# Patient Record
Sex: Male | Born: 1960 | Race: Black or African American | Hispanic: No | Marital: Married | Smoking: Never smoker
Health system: Southern US, Community
[De-identification: ages and names within clinical notes are randomized; demographics above are authoritative.]

## PROBLEM LIST (undated history)

## (undated) DIAGNOSIS — I639 Cerebral infarction, unspecified: Secondary | ICD-10-CM

## (undated) DIAGNOSIS — N19 Unspecified kidney failure: Secondary | ICD-10-CM

## (undated) DIAGNOSIS — N186 End stage renal disease: Secondary | ICD-10-CM

## (undated) DIAGNOSIS — I10 Essential (primary) hypertension: Secondary | ICD-10-CM

## (undated) DIAGNOSIS — D571 Sickle-cell disease without crisis: Secondary | ICD-10-CM

## (undated) DIAGNOSIS — Z992 Dependence on renal dialysis: Secondary | ICD-10-CM

## (undated) HISTORY — PX: KIDNEY STONE SURGERY: SHX686

---

## 1998-05-09 ENCOUNTER — Encounter: Payer: Self-pay | Admitting: Emergency Medicine

## 1998-05-09 ENCOUNTER — Emergency Department (HOSPITAL_COMMUNITY): Admission: EM | Admit: 1998-05-09 | Discharge: 1998-05-09 | Payer: Self-pay | Admitting: *Deleted

## 1999-05-23 ENCOUNTER — Emergency Department (HOSPITAL_COMMUNITY): Admission: EM | Admit: 1999-05-23 | Discharge: 1999-05-23 | Payer: Self-pay | Admitting: *Deleted

## 1999-11-12 ENCOUNTER — Encounter: Payer: Self-pay | Admitting: Emergency Medicine

## 1999-11-12 ENCOUNTER — Emergency Department (HOSPITAL_COMMUNITY): Admission: EM | Admit: 1999-11-12 | Discharge: 1999-11-12 | Payer: Self-pay | Admitting: Emergency Medicine

## 2000-07-22 ENCOUNTER — Inpatient Hospital Stay (HOSPITAL_COMMUNITY): Admission: EM | Admit: 2000-07-22 | Discharge: 2000-07-27 | Payer: Self-pay | Admitting: Emergency Medicine

## 2000-07-23 ENCOUNTER — Encounter: Payer: Self-pay | Admitting: Family Medicine

## 2000-07-24 ENCOUNTER — Encounter: Payer: Self-pay | Admitting: Nephrology

## 2000-07-29 ENCOUNTER — Encounter: Admission: RE | Admit: 2000-07-29 | Discharge: 2000-07-29 | Payer: Self-pay | Admitting: Sports Medicine

## 2000-09-23 ENCOUNTER — Encounter: Admission: RE | Admit: 2000-09-23 | Discharge: 2000-12-22 | Payer: Self-pay | Admitting: Family Medicine

## 2003-05-22 ENCOUNTER — Emergency Department (HOSPITAL_COMMUNITY): Admission: EM | Admit: 2003-05-22 | Discharge: 2003-05-23 | Payer: Self-pay | Admitting: Emergency Medicine

## 2003-06-03 ENCOUNTER — Emergency Department (HOSPITAL_COMMUNITY): Admission: EM | Admit: 2003-06-03 | Discharge: 2003-06-03 | Payer: Self-pay | Admitting: Emergency Medicine

## 2003-12-02 ENCOUNTER — Emergency Department (HOSPITAL_COMMUNITY): Admission: EM | Admit: 2003-12-02 | Discharge: 2003-12-02 | Payer: Self-pay | Admitting: Family Medicine

## 2004-06-25 ENCOUNTER — Encounter: Admission: RE | Admit: 2004-06-25 | Discharge: 2004-06-25 | Payer: Self-pay | Admitting: Occupational Medicine

## 2004-08-21 ENCOUNTER — Encounter: Admission: RE | Admit: 2004-08-21 | Discharge: 2004-11-19 | Payer: Self-pay | Admitting: Internal Medicine

## 2004-11-26 ENCOUNTER — Encounter: Admission: RE | Admit: 2004-11-26 | Discharge: 2004-11-26 | Payer: Self-pay | Admitting: Internal Medicine

## 2006-01-02 ENCOUNTER — Encounter: Payer: Self-pay | Admitting: Family Medicine

## 2006-01-03 ENCOUNTER — Inpatient Hospital Stay (HOSPITAL_COMMUNITY): Admission: EM | Admit: 2006-01-03 | Discharge: 2006-01-06 | Payer: Self-pay | Admitting: Internal Medicine

## 2006-01-05 ENCOUNTER — Encounter: Payer: Self-pay | Admitting: Vascular Surgery

## 2006-01-05 ENCOUNTER — Encounter (INDEPENDENT_AMBULATORY_CARE_PROVIDER_SITE_OTHER): Payer: Self-pay | Admitting: Interventional Cardiology

## 2006-08-28 ENCOUNTER — Encounter: Admission: RE | Admit: 2006-08-28 | Discharge: 2006-08-28 | Payer: Self-pay | Admitting: Internal Medicine

## 2007-01-19 ENCOUNTER — Encounter: Payer: Self-pay | Admitting: Emergency Medicine

## 2007-01-19 ENCOUNTER — Inpatient Hospital Stay (HOSPITAL_COMMUNITY): Admission: EM | Admit: 2007-01-19 | Discharge: 2007-01-21 | Payer: Self-pay

## 2007-01-21 ENCOUNTER — Encounter (INDEPENDENT_AMBULATORY_CARE_PROVIDER_SITE_OTHER): Payer: Self-pay | Admitting: Internal Medicine

## 2008-09-04 ENCOUNTER — Emergency Department (HOSPITAL_COMMUNITY): Admission: EM | Admit: 2008-09-04 | Discharge: 2008-09-04 | Payer: Self-pay | Admitting: Emergency Medicine

## 2008-10-13 ENCOUNTER — Emergency Department (HOSPITAL_COMMUNITY): Admission: EM | Admit: 2008-10-13 | Discharge: 2008-10-13 | Payer: Self-pay | Admitting: Emergency Medicine

## 2008-11-21 ENCOUNTER — Encounter (HOSPITAL_COMMUNITY): Admission: RE | Admit: 2008-11-21 | Discharge: 2009-02-19 | Payer: Self-pay | Admitting: Nephrology

## 2008-12-08 ENCOUNTER — Other Ambulatory Visit: Payer: Self-pay | Admitting: Nephrology

## 2008-12-19 ENCOUNTER — Ambulatory Visit: Payer: Self-pay | Admitting: Vascular Surgery

## 2008-12-20 ENCOUNTER — Encounter: Admission: RE | Admit: 2008-12-20 | Discharge: 2009-02-14 | Payer: Self-pay | Admitting: Nephrology

## 2009-01-25 ENCOUNTER — Emergency Department (HOSPITAL_COMMUNITY): Admission: EM | Admit: 2009-01-25 | Discharge: 2009-01-25 | Payer: Self-pay | Admitting: Emergency Medicine

## 2009-02-20 ENCOUNTER — Encounter: Admission: RE | Admit: 2009-02-20 | Discharge: 2009-02-20 | Payer: Self-pay | Admitting: Nephrology

## 2009-03-01 ENCOUNTER — Encounter (HOSPITAL_COMMUNITY): Admission: RE | Admit: 2009-03-01 | Discharge: 2009-05-28 | Payer: Self-pay | Admitting: Nephrology

## 2009-03-03 ENCOUNTER — Inpatient Hospital Stay (HOSPITAL_COMMUNITY): Admission: EM | Admit: 2009-03-03 | Discharge: 2009-03-03 | Payer: Self-pay | Admitting: Emergency Medicine

## 2009-03-13 ENCOUNTER — Ambulatory Visit: Payer: Self-pay | Admitting: Vascular Surgery

## 2009-03-23 ENCOUNTER — Ambulatory Visit: Payer: Self-pay | Admitting: Vascular Surgery

## 2009-03-23 ENCOUNTER — Ambulatory Visit (HOSPITAL_COMMUNITY): Admission: RE | Admit: 2009-03-23 | Discharge: 2009-03-23 | Payer: Self-pay | Admitting: Vascular Surgery

## 2009-05-08 ENCOUNTER — Ambulatory Visit: Payer: Self-pay | Admitting: Vascular Surgery

## 2009-05-29 ENCOUNTER — Emergency Department (HOSPITAL_COMMUNITY): Admission: EM | Admit: 2009-05-29 | Discharge: 2009-05-29 | Payer: Self-pay | Admitting: Emergency Medicine

## 2009-08-02 ENCOUNTER — Inpatient Hospital Stay (HOSPITAL_COMMUNITY): Admission: AD | Admit: 2009-08-02 | Discharge: 2009-08-11 | Payer: Self-pay | Admitting: Nephrology

## 2009-10-14 ENCOUNTER — Emergency Department (HOSPITAL_COMMUNITY): Admission: EM | Admit: 2009-10-14 | Discharge: 2009-10-14 | Payer: Self-pay | Admitting: Emergency Medicine

## 2010-03-10 ENCOUNTER — Encounter: Payer: Self-pay | Admitting: Nephrology

## 2010-04-21 ENCOUNTER — Emergency Department (HOSPITAL_COMMUNITY): Payer: 59

## 2010-04-21 ENCOUNTER — Emergency Department (HOSPITAL_COMMUNITY)
Admission: EM | Admit: 2010-04-21 | Discharge: 2010-04-21 | Disposition: A | Discharge status: Home / Self Care | Payer: 59 | Attending: Emergency Medicine | Admitting: Emergency Medicine

## 2010-04-21 DIAGNOSIS — I120 Hypertensive chronic kidney disease with stage 5 chronic kidney disease or end stage renal disease: Secondary | ICD-10-CM | POA: Insufficient documentation

## 2010-04-21 DIAGNOSIS — R0602 Shortness of breath: Secondary | ICD-10-CM | POA: Insufficient documentation

## 2010-04-21 DIAGNOSIS — N186 End stage renal disease: Secondary | ICD-10-CM | POA: Insufficient documentation

## 2010-04-21 DIAGNOSIS — Z8673 Personal history of transient ischemic attack (TIA), and cerebral infarction without residual deficits: Secondary | ICD-10-CM | POA: Insufficient documentation

## 2010-04-21 DIAGNOSIS — Z992 Dependence on renal dialysis: Secondary | ICD-10-CM | POA: Insufficient documentation

## 2010-04-21 DIAGNOSIS — R079 Chest pain, unspecified: Secondary | ICD-10-CM | POA: Insufficient documentation

## 2010-04-21 DIAGNOSIS — F411 Generalized anxiety disorder: Secondary | ICD-10-CM | POA: Insufficient documentation

## 2010-04-21 DIAGNOSIS — R51 Headache: Secondary | ICD-10-CM | POA: Insufficient documentation

## 2010-04-21 LAB — BASIC METABOLIC PANEL
CO2: 28 mEq/L (ref 19–32)
Calcium: 9.4 mg/dL (ref 8.4–10.5)
GFR calc Af Amer: 5 mL/min — ABNORMAL LOW (ref >60)
GFR calc non Af Amer: 4 mL/min — ABNORMAL LOW (ref >60)
Glucose, Bld: 113 mg/dL — ABNORMAL HIGH (ref 70–99)
Potassium: 4.6 mEq/L (ref 3.5–5.1)
Sodium: 135 mEq/L (ref 135–145)

## 2010-05-02 LAB — URINE MICROSCOPIC-ADD ON

## 2010-05-02 LAB — DIFFERENTIAL
Blasts: 0 %
Eosinophils Absolute: 2.1 10*3/uL — ABNORMAL HIGH (ref 0.0–0.7)
Eosinophils Relative: 14 % — ABNORMAL HIGH (ref 0–5)
Metamyelocytes Relative: 0 %
Myelocytes: 0 %
Promyelocytes Absolute: 0 %
nRBC: 0 /100 WBC

## 2010-05-02 LAB — BASIC METABOLIC PANEL
CO2: 26 mEq/L (ref 19–32)
Calcium: 8.8 mg/dL (ref 8.4–10.5)
Creatinine, Ser: 9.52 mg/dL — ABNORMAL HIGH (ref 0.4–1.5)
GFR calc non Af Amer: 6 mL/min — ABNORMAL LOW (ref >60)
Glucose, Bld: 171 mg/dL — ABNORMAL HIGH (ref 70–99)

## 2010-05-02 LAB — CBC
MCH: 31.3 pg (ref 26.0–34.0)
MCHC: 32.8 g/dL (ref 30.0–36.0)
Platelets: 397 10*3/uL (ref 150–400)
RDW: 17.9 % — ABNORMAL HIGH (ref 11.5–15.5)

## 2010-05-02 LAB — URINALYSIS, ROUTINE W REFLEX MICROSCOPIC
Nitrite: NEGATIVE
Specific Gravity, Urine: 1.014 (ref 1.005–1.030)
Urobilinogen, UA: 0.2 mg/dL (ref 0.0–1.0)
pH: 7.5 (ref 5.0–8.0)

## 2010-05-02 LAB — POCT CARDIAC MARKERS
Myoglobin, poc: 404 ng/mL (ref 12–200)
Troponin i, poc: <0.05 ng/mL (ref 0.00–0.09)
Troponin i, poc: <0.05 ng/mL (ref 0.00–0.09)

## 2010-05-04 LAB — COMPREHENSIVE METABOLIC PANEL
ALT: 29 U/L (ref 0–53)
AST: 25 U/L (ref 0–37)
CO2: 20 mEq/L (ref 19–32)
Calcium: 8 mg/dL — ABNORMAL LOW (ref 8.4–10.5)
GFR calc Af Amer: 6 mL/min — ABNORMAL LOW (ref >60)
Sodium: 129 mEq/L — ABNORMAL LOW (ref 135–145)
Total Protein: 6.5 g/dL (ref 6.0–8.3)

## 2010-05-04 LAB — URINALYSIS, ROUTINE W REFLEX MICROSCOPIC
Bilirubin Urine: NEGATIVE
Glucose, UA: 100 mg/dL — AB
Ketones, ur: NEGATIVE mg/dL
Leukocytes, UA: NEGATIVE
pH: 5.5 (ref 5.0–8.0)

## 2010-05-04 LAB — CBC
MCHC: 34.9 g/dL (ref 30.0–36.0)
RBC: 3.24 MIL/uL — ABNORMAL LOW (ref 4.22–5.81)

## 2010-05-04 LAB — URINE MICROSCOPIC-ADD ON

## 2010-05-04 LAB — FERRITIN: Ferritin: 700 ng/mL — ABNORMAL HIGH (ref 22–322)

## 2010-05-04 LAB — HEPATITIS B SURFACE ANTIGEN: Hepatitis B Surface Ag: NEGATIVE

## 2010-05-04 LAB — DIFFERENTIAL
Eosinophils Absolute: 0.8 10*3/uL — ABNORMAL HIGH (ref 0.0–0.7)
Eosinophils Relative: 5 % (ref 0–5)
Lymphs Abs: 1.8 10*3/uL (ref 0.7–4.0)
Monocytes Relative: 11 % (ref 3–12)

## 2010-05-04 LAB — PTH, INTACT AND CALCIUM: PTH: 486.8 pg/mL — ABNORMAL HIGH (ref 14.0–72.0)

## 2010-05-04 LAB — IRON AND TIBC: UIBC: <55 ug/dL

## 2010-05-04 LAB — PHOSPHORUS: Phosphorus: 6.4 mg/dL — ABNORMAL HIGH (ref 2.3–4.6)

## 2010-05-05 LAB — CBC
HCT: 20.1 % — ABNORMAL LOW (ref 39.0–52.0)
HCT: 22 % — ABNORMAL LOW (ref 39.0–52.0)
HCT: 23.5 % — ABNORMAL LOW (ref 39.0–52.0)
HCT: 25.8 % — ABNORMAL LOW (ref 39.0–52.0)
HCT: 29.1 % — ABNORMAL LOW (ref 39.0–52.0)
Hemoglobin: 6.8 g/dL — CL (ref 13.0–17.0)
Hemoglobin: 7.9 g/dL — ABNORMAL LOW (ref 13.0–17.0)
Hemoglobin: 8.9 g/dL — ABNORMAL LOW (ref 13.0–17.0)
MCH: 31.4 pg (ref 26.0–34.0)
MCHC: 33.4 g/dL (ref 30.0–36.0)
MCHC: 33.7 g/dL (ref 30.0–36.0)
MCHC: 33.7 g/dL (ref 30.0–36.0)
MCHC: 33.7 g/dL (ref 30.0–36.0)
MCHC: 33.7 g/dL (ref 30.0–36.0)
MCHC: 34.3 g/dL (ref 30.0–36.0)
MCHC: 34.6 g/dL (ref 30.0–36.0)
MCV: 91.1 fL (ref 78.0–100.0)
MCV: 91.9 fL (ref 78.0–100.0)
MCV: 92.1 fL (ref 78.0–100.0)
MCV: 92.1 fL (ref 78.0–100.0)
MCV: 92.7 fL (ref 78.0–100.0)
MCV: 92.8 fL (ref 78.0–100.0)
Platelets: 281 10*3/uL (ref 150–400)
Platelets: 288 K/uL (ref 150–400)
Platelets: 290 10*3/uL (ref 150–400)
Platelets: 310 10*3/uL (ref 150–400)
Platelets: 316 10*3/uL (ref 150–400)
Platelets: 346 10*3/uL (ref 150–400)
RBC: 2.17 MIL/uL — ABNORMAL LOW (ref 4.22–5.81)
RBC: 2.79 MIL/uL — ABNORMAL LOW (ref 4.22–5.81)
RBC: 3.06 MIL/uL — ABNORMAL LOW (ref 4.22–5.81)
RDW: 14.2 % (ref 11.5–15.5)
RDW: 14.2 % (ref 11.5–15.5)
RDW: 14.4 % (ref 11.5–15.5)
RDW: 14.5 % (ref 11.5–15.5)
RDW: 14.6 % (ref 11.5–15.5)
RDW: 14.7 % (ref 11.5–15.5)
RDW: 14.7 % (ref 11.5–15.5)
WBC: 12.2 10*3/uL — ABNORMAL HIGH (ref 4.0–10.5)
WBC: 12.4 K/uL — ABNORMAL HIGH (ref 4.0–10.5)
WBC: 16.2 10*3/uL — ABNORMAL HIGH (ref 4.0–10.5)

## 2010-05-05 LAB — URINALYSIS, ROUTINE W REFLEX MICROSCOPIC
Bilirubin Urine: NEGATIVE
Bilirubin Urine: NEGATIVE
Glucose, UA: NEGATIVE mg/dL
Ketones, ur: NEGATIVE mg/dL
Ketones, ur: NEGATIVE mg/dL
Leukocytes, UA: NEGATIVE
Nitrite: NEGATIVE
Protein, ur: 100 mg/dL — AB
Specific Gravity, Urine: 1.006 (ref 1.005–1.030)
Specific Gravity, Urine: 1.012 (ref 1.005–1.030)
Urobilinogen, UA: 0.2 mg/dL (ref 0.0–1.0)
pH: 5.5 (ref 5.0–8.0)
pH: 7.5 (ref 5.0–8.0)

## 2010-05-05 LAB — CULTURE, BLOOD (ROUTINE X 2)

## 2010-05-05 LAB — DIFFERENTIAL
Basophils Absolute: 0 10*3/uL (ref 0.0–0.1)
Basophils Absolute: 0 10*3/uL (ref 0.0–0.1)
Basophils Absolute: 0.1 10*3/uL (ref 0.0–0.1)
Basophils Absolute: 0.1 K/uL (ref 0.0–0.1)
Basophils Relative: 0 % (ref 0–1)
Basophils Relative: 0 % (ref 0–1)
Basophils Relative: 1 % (ref 0–1)
Basophils Relative: 1 % (ref 0–1)
Basophils Relative: 1 % (ref 0–1)
Eosinophils Absolute: 0.6 10*3/uL (ref 0.0–0.7)
Eosinophils Absolute: 0.6 10*3/uL (ref 0.0–0.7)
Eosinophils Absolute: 0.6 K/uL (ref 0.0–0.7)
Eosinophils Absolute: 0.7 10*3/uL (ref 0.0–0.7)
Eosinophils Absolute: 0.8 10*3/uL — ABNORMAL HIGH (ref 0.0–0.7)
Eosinophils Relative: 4 % (ref 0–5)
Eosinophils Relative: 5 % (ref 0–5)
Lymphocytes Relative: 11 % — ABNORMAL LOW (ref 12–46)
Lymphocytes Relative: 14 % (ref 12–46)
Lymphocytes Relative: 15 % (ref 12–46)
Lymphs Abs: 1.7 10*3/uL (ref 0.7–4.0)
Lymphs Abs: 1.7 K/uL (ref 0.7–4.0)
Lymphs Abs: 1.8 10*3/uL (ref 0.7–4.0)
Monocytes Absolute: 1.4 10*3/uL — ABNORMAL HIGH (ref 0.1–1.0)
Monocytes Absolute: 1.5 K/uL — ABNORMAL HIGH (ref 0.1–1.0)
Monocytes Absolute: 2 10*3/uL — ABNORMAL HIGH (ref 0.1–1.0)
Monocytes Relative: 10 % (ref 3–12)
Monocytes Relative: 12 % (ref 3–12)
Monocytes Relative: 9 % (ref 3–12)
Neutro Abs: 11.2 10*3/uL — ABNORMAL HIGH (ref 1.7–7.7)
Neutro Abs: 15 10*3/uL — ABNORMAL HIGH (ref 1.7–7.7)
Neutro Abs: 8.6 K/uL — ABNORMAL HIGH (ref 1.7–7.7)
Neutrophils Relative %: 66 % (ref 43–77)
Neutrophils Relative %: 69 % (ref 43–77)
Neutrophils Relative %: 73 % (ref 43–77)
Neutrophils Relative %: 73 % (ref 43–77)
Neutrophils Relative %: 75 % (ref 43–77)
Neutrophils Relative %: 75 % (ref 43–77)

## 2010-05-05 LAB — URINE MICROSCOPIC-ADD ON

## 2010-05-05 LAB — RENAL FUNCTION PANEL
Albumin: 3.2 g/dL — ABNORMAL LOW (ref 3.5–5.2)
Albumin: 3.4 g/dL — ABNORMAL LOW (ref 3.5–5.2)
Albumin: 3.4 g/dL — ABNORMAL LOW (ref 3.5–5.2)
Albumin: 3.5 g/dL (ref 3.5–5.2)
Albumin: 3.5 g/dL (ref 3.5–5.2)
Albumin: 3.6 g/dL (ref 3.5–5.2)
Albumin: 3.6 g/dL (ref 3.5–5.2)
BUN: 41 mg/dL — ABNORMAL HIGH (ref 6–23)
BUN: 52 mg/dL — ABNORMAL HIGH (ref 6–23)
BUN: 69 mg/dL — ABNORMAL HIGH (ref 6–23)
BUN: 78 mg/dL — ABNORMAL HIGH (ref 6–23)
CO2: 19 mEq/L (ref 19–32)
Calcium: 8.4 mg/dL (ref 8.4–10.5)
Calcium: 8.5 mg/dL (ref 8.4–10.5)
Calcium: 8.6 mg/dL (ref 8.4–10.5)
Calcium: 8.6 mg/dL (ref 8.4–10.5)
Calcium: 9 mg/dL (ref 8.4–10.5)
Chloride: 100 mEq/L (ref 96–112)
Chloride: 101 mEq/L (ref 96–112)
Chloride: 104 mEq/L (ref 96–112)
Chloride: 104 mEq/L (ref 96–112)
Chloride: 106 mEq/L (ref 96–112)
Creatinine, Ser: 11.25 mg/dL — ABNORMAL HIGH (ref 0.4–1.5)
Creatinine, Ser: 12.16 mg/dL — ABNORMAL HIGH (ref 0.4–1.5)
Creatinine, Ser: 9.24 mg/dL — ABNORMAL HIGH (ref 0.4–1.5)
Creatinine, Ser: 9.86 mg/dL — ABNORMAL HIGH (ref 0.4–1.5)
GFR calc Af Amer: 5 mL/min — ABNORMAL LOW (ref >60)
GFR calc Af Amer: 7 mL/min — ABNORMAL LOW (ref >60)
GFR calc Af Amer: 7 mL/min — ABNORMAL LOW (ref >60)
GFR calc Af Amer: 7 mL/min — ABNORMAL LOW (ref >60)
GFR calc Af Amer: 8 mL/min — ABNORMAL LOW (ref >60)
GFR calc Af Amer: 8 mL/min — ABNORMAL LOW (ref >60)
GFR calc non Af Amer: 4 mL/min — ABNORMAL LOW (ref >60)
GFR calc non Af Amer: 5 mL/min — ABNORMAL LOW (ref >60)
GFR calc non Af Amer: 6 mL/min — ABNORMAL LOW (ref >60)
GFR calc non Af Amer: 6 mL/min — ABNORMAL LOW (ref >60)
GFR calc non Af Amer: 6 mL/min — ABNORMAL LOW (ref >60)
Glucose, Bld: 110 mg/dL — ABNORMAL HIGH (ref 70–99)
Glucose, Bld: 134 mg/dL — ABNORMAL HIGH (ref 70–99)
Glucose, Bld: 93 mg/dL (ref 70–99)
Phosphorus: 4.2 mg/dL (ref 2.3–4.6)
Phosphorus: 4.6 mg/dL (ref 2.3–4.6)
Phosphorus: 5 mg/dL — ABNORMAL HIGH (ref 2.3–4.6)
Phosphorus: 5 mg/dL — ABNORMAL HIGH (ref 2.3–4.6)
Phosphorus: 5.1 mg/dL — ABNORMAL HIGH (ref 2.3–4.6)
Phosphorus: 5.4 mg/dL — ABNORMAL HIGH (ref 2.3–4.6)
Potassium: 4.1 mEq/L (ref 3.5–5.1)
Potassium: 4.3 mEq/L (ref 3.5–5.1)
Potassium: 4.4 mEq/L (ref 3.5–5.1)
Potassium: 4.5 mEq/L (ref 3.5–5.1)
Potassium: 5.1 mEq/L (ref 3.5–5.1)
Sodium: 131 mEq/L — ABNORMAL LOW (ref 135–145)
Sodium: 133 mEq/L — ABNORMAL LOW (ref 135–145)
Sodium: 133 mEq/L — ABNORMAL LOW (ref 135–145)
Sodium: 133 mEq/L — ABNORMAL LOW (ref 135–145)
Sodium: 133 mEq/L — ABNORMAL LOW (ref 135–145)
Sodium: 135 mEq/L (ref 135–145)

## 2010-05-05 LAB — GLUCOSE, CAPILLARY
Glucose-Capillary: 111 mg/dL — ABNORMAL HIGH (ref 70–99)
Glucose-Capillary: 113 mg/dL — ABNORMAL HIGH (ref 70–99)
Glucose-Capillary: 113 mg/dL — ABNORMAL HIGH (ref 70–99)
Glucose-Capillary: 114 mg/dL — ABNORMAL HIGH (ref 70–99)
Glucose-Capillary: 122 mg/dL — ABNORMAL HIGH (ref 70–99)
Glucose-Capillary: 122 mg/dL — ABNORMAL HIGH (ref 70–99)

## 2010-05-05 LAB — POCT HEMOGLOBIN-HEMACUE
Hemoglobin: 11 g/dL — ABNORMAL LOW (ref 13.0–17.0)
Hemoglobin: 11.1 g/dL — ABNORMAL LOW (ref 13.0–17.0)

## 2010-05-05 LAB — IRON AND TIBC
Saturation Ratios: 25 % (ref 20–55)
UIBC: 159 ug/dL

## 2010-05-05 LAB — CROSSMATCH: Antibody Screen: NEGATIVE

## 2010-05-05 LAB — URINE CULTURE

## 2010-05-05 LAB — ABO/RH: ABO/RH(D): A POS

## 2010-05-05 LAB — FERRITIN: Ferritin: 804 ng/mL — ABNORMAL HIGH (ref 22–322)

## 2010-05-05 LAB — HEPATITIS B SURFACE ANTIBODY,QUALITATIVE: Hep B S Ab: NEGATIVE

## 2010-05-08 LAB — POCT HEMOGLOBIN-HEMACUE: Hemoglobin: 11.6 g/dL — ABNORMAL LOW (ref 13.0–17.0)

## 2010-05-21 LAB — POCT HEMOGLOBIN-HEMACUE: Hemoglobin: 11 g/dL — ABNORMAL LOW (ref 13.0–17.0)

## 2010-05-21 LAB — IRON AND TIBC
Iron: 35 ug/dL — ABNORMAL LOW (ref 42–135)
Saturation Ratios: 13 % — ABNORMAL LOW (ref 20–55)
UIBC: 228 ug/dL

## 2010-05-22 LAB — IRON AND TIBC
Saturation Ratios: 13 % — ABNORMAL LOW (ref 20–55)
TIBC: 230 ug/dL (ref 215–435)

## 2010-05-22 LAB — FERRITIN: Ferritin: 130 ng/mL (ref 22–322)

## 2010-05-22 LAB — POCT HEMOGLOBIN-HEMACUE
Hemoglobin: 10 g/dL — ABNORMAL LOW (ref 13.0–17.0)
Hemoglobin: 10.1 g/dL — ABNORMAL LOW (ref 13.0–17.0)

## 2010-05-23 LAB — POCT HEMOGLOBIN-HEMACUE
Hemoglobin: 8.7 g/dL — ABNORMAL LOW (ref 13.0–17.0)
Hemoglobin: 8.9 g/dL — ABNORMAL LOW (ref 13.0–17.0)

## 2010-07-02 NOTE — Assessment & Plan Note (Signed)
OFFICE VISIT   MOISE, FRIDAY  DOB:  01-11-1961                                       05/08/2009  ZOXWR#:[id number]   The patient is a patient with end-stage renal disease and had creation  of left radial artery to cephalic vein AV fistula or Cimino shunt on  February 4.  He is not yet on hemodialysis.  He has had no pain or  numbness in the left hand.   On exam the incision has healed nicely and he has a pulse and palpable  thrill from the fistula from the wrist up to the antecubital area.  It  appears that this will be adequate fistula for hemodialysis.  He was  encouraged to continue to exercise the left arm and will return to see  [place] on a p.r.n. basis.     Quita Skye Hart Rochester, M.D.  Electronically Signed   JDL/MEDQ  D:  05/08/2009  T:  05/09/2009  Job:  [id number]

## 2010-07-02 NOTE — H&P (Signed)
NAME:  Gabriel Bell, Gabriel Bell NO.:  [id number]   MEDICAL RECORD NO.:  [id number]          PATIENT TYPE:  EMS   LOCATION:  ED                           FACILITY[facility]  [facility]   PHYSICIAN:  Thomasenia Bottoms, MDDATE OF BIRTH:  25-Aug-1960   DATE OF ADMISSION:  01/19/2007  DATE OF DISCHARGE:                              HISTORY & PHYSICAL   CHIEF COMPLAINT:  Chest pain.   HISTORY OF PRESENTING ILLNESS:  Gabriel Bell is a 50 year old gentleman  with a history of poorly-controlled hypertension who reports to the  hospital today with a 4-day history of mid sternal chest pain.  He says  for the last several days the pain would shoot out through his spine,  felt like something was stabbing him in the back.  He denies any  diaphoresis or nausea.  The back pain has resolved.  He says it is  totally gone today, but the chest pain has continued.  The patient has  been taking Motrin for the pain, and the Motrin does help for a couple  hours, but as soon as it wears off, the pain is back.  The patient has  been told before that he should not be taking Motrin, so he really got  worried and was not sure what else to do.  He has had some shortness of  breath with this chest discomfort as well, but no nausea, no heartburn  or acid reflux symptoms, no diaphoresis or cough.  He has not had  discomfort like this before.   PAST MEDICAL HISTORY:  His past medical history is significant for  hypertension, which has been difficult to control in the past.  He was  admitted in 2007 with significant hypertension and actually had a  negative catecholamine workup for pheochromocytoma.  He says he has a  history of a CVA in 2007 and dyslipidemia, as well.  He has obesity.  He  has been told that he has borderline diabetes and is controlling that  with diet, no medication.   MEDICATIONS:  On arrival include:  1. Benicar 40 mg p.o. daily.  2. Coreg CR 40 mg p.o. daily.  3. Clonidine 0.1 mg twice a day.  4. He is on an aspirin daily and Vytorin 10/40 one daily.   SOCIAL HISTORY:  He does not smoke cigarettes, drink alcohol use any  illicit drugs.   FAMILY HISTORY:  He is unaware of any coronary artery disease.  He does  not know much of his family history, says his grandfather which was  actually murdered at age 49.   REVIEW OF SYSTEMS:  CONSTITUTIONAL:  He says his appetite is poor but  that he has been gaining weight.  HEENT:  He did not have a headache  before being in the emergency department, says he may have some double  vision.  No difficulty swallowing.  No ringing in his ears.  CARDIOVASCULAR:  The chest pains as mentioned above.  No significant  lower extremity edema.  RESPIRATORY:  Shortness of breath since he has  had the chest pain, and he does describe  some chronic dyspnea on  exertion.  No orthopnea.  He sleeps in a chair, because it hurts his  side when he lay flat on the bed.  He denies any hemoptysis.  GI:  He  denies any blood in his stools.  He does have some constipation trouble.  He also reports some early satiety symptoms, but no heartburn or reflux  symptoms.  GU:  He denies hematuria.  PSYCHIATRIC:  He says he has a lot  of stress and does have difficulty sleeping.  MUSCULOSKELETAL:  He has a  various aches and pains, including this right side pain.  NEUROLOGIC:  No history of seizures.  From his previous stroke, he does have some  left-sided weakness and tenderness in the left lower leg.  All other  systems reviewed are negative.   On arrival in the emergency department, his blood pressure was 148/132,  pulse 60, respiratory rate 20, O2 sats 95% on room air.   PHYSICAL EXAMINATION:  GENERAL:  On physical examination, he is an obese  gentleman in no acute distress.  HEENT:  Exam is normocephalic, atraumatic.  Sclerae nonicteric.  Oral  mucosa moist.  NECK:  Supple.  No lymphadenopathy, no thyromegaly, no jugular venous  distention while sitting up at 90  degrees.  CARDIAC:  Exam is regular rate and rhythm, but his heart sounds are  somewhat distant.  LUNGS:  Clear to auscultation bilaterally with no wheezes, rhonchi or  rales.  ABDOMEN:  His abdomen is obese.  He has some mild tenderness over the  right rib cage and some in the right upper quadrant area.  No rebound or  guarding.  He does have bowel sounds.  No masses are appreciated.  EXTREMITIES:  His extremities reveal no evidence of clubbing, cyanosis  or edema.  He has faintly palpable DP pulses.  NEUROLOGICALLY:  The  patient is alert and oriented x3.  He has no slurred speech.  He has 5/5  strength in each of his extremities.  His sensory exam is grossly  intact.  MUSCULOSKELETAL:  Examination reveals no evidence of effusion of his  joints.  SKIN:  His skin is intact with no open lesions or rashes.   DATA:  The patient's sodium is 140, potassium 4.4, chloride 107, bicarb  20, glucose 108, BUN 21, creatinine 2.39.  His fibrin split products are  slightly high at 0.9.  His EKG reveals normal sinus rhythm with a rate  of 64.  He has some evidence of left axis deviation and some T-wave  flattening but no ST-segment elevations or depressions.   ASSESSMENT AND PLAN:  1. Atypical chest pain in a 50 year old with significant hypertension,      hyperlipidemia and obesity.  The plan is to admit him to the      hospital.  We will rule him out for MI.  Because of his severe high      blood pressure and the fact the pain was radiating into his back,      the patient will have an MRI to rule out dissection.  A CT angio is      contraindicated because of his renal function.  2. Malignant hypertension.  Initially, the patient's blood pressure      was 248/132.  In the emergency department, he received clonidine,      IV labetalol and sublingual nitroglycerin, and his last two      systolic blood pressures were 161 and 170, so his blood pressures  are improving.  I will add Norvasc to the  patient's blood pressure      regimen, as he is slightly bradycardic, and the Norvasc may be more      effective.  Would also like to increase his clonidine to t.i.d.      From his report, his blood pressure is poorly controlled even as an      outpatient on a good day.  3. Significant renal insufficiency with a creatinine of 2.39.  The      patient had known renal insufficiency, and his creatinine was 1.9.      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  Would consider holding      his Benicar at this juncture.  The patient of note, also did have      an echocardiogram in 2007 which revealed an EF of 50% with moderate      hypokinesis of the basal mid-posterior lateral wall, and he also      had increased LV thickness and left ventricle dilatation.      Thomasenia Bottoms, MD  Electronically Signed     CVC/MEDQ  D:  01/19/2007  T:  01/20/2007  Job:  [id number]   cc:   Olene Craven, M.D.  Fax: [phone]

## 2010-07-02 NOTE — Assessment & Plan Note (Signed)
OFFICE VISIT   Gabriel Bell, Gabriel Bell  DOB:  14-Jun-1960                                       03/13/2009  ZOXWR#:[id number]   The patient is a patient referred by Dr. Allena Katz with end-stage renal  disease who I evaluated in November of 2010 for vascular access.  He did  not show up for his surgical procedure which was scheduled for November  17.  He now is re-referrred for vascular access consideration.  He is  right-handed.   PAST MEDICAL HISTORY:  1. Hypertension.  2. Hyperlipidemia.  3. End-stage renal disease.  4. Impaired glucose tolerance.  5. Possible bipolar disorder.  6. Morbid obesity.  7. He denies any history of chest pain, dyspnea on exertion, PND or      orthopnea at the present time.   PHYSICAL EXAMINATION:  Vital signs:  Blood pressure is 199/122, heart  rate 67, respirations 14, temperature 97.8.  General:  He is alert,  oriented and in no apparent distress.  HEENT:  Exam unremarkable.  Neck:  Carotid pulses 3+ with no bruits.  Left upper extremity reveals a 3+  brachial and radial pulse.  He does have morbid obesity.   Vein mapping from previous evaluation was reviewed and it does appear  that he is borderline with possibly adequate vein in the left forearm  for fistula creation.  Upper arm is also borderline.   Creation of left forearm AV fistula versus upper arm AV fistula on  Friday, February 4.  Hopefully this will provide a satisfactory access  for this nice man.     Quita Skye Hart [place], M.D.  Electronically Signed   JDL/MEDQ  D:  03/13/2009  T:  03/14/2009  Job:  [id number]

## 2010-07-02 NOTE — Consult Note (Signed)
NEW PATIENT CONSULTATION   Gabriel Bell, Gabriel Bell  DOB:  18-Jul-1960                                       12/19/2008  ZOXWR#:[id number]   Mr. Taite is a 50 year old patient with end-stage renal disease referred  by Dr. Allena Katz for vascular access.  He is right-handed.  He has never had  vascular access nor hemodialysis in the past.  His creatinine has risen  from 3.3 last December to 7.9 this month and has compliance problems.  He will probably need vascular access.  He will need hemodialysis in the  near future.  Chronic medical problems are stable and include:  1. Hypertension.  2. Hyperlipidemia.  3. End-stage renal disease.  4. Impaired glucose tolerance.  5. Possible bipolar disorder.  6. Morbid obesity.   ALLERGIES:  None known.   SOCIAL HISTORY:  He is married and lives at home with his supportive  wife.  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  Denies any tobacco or alcohol use.   FAMILY HISTORY:  He has an extensive history of family members with  hypertension and diabetes.  He does not use tobacco or alcohol.  He is  disabled and has 3 children.   REVIEW OF SYSTEMS:  He does have occasional chest discomfort and dyspnea  on exertion, occasional wheezing, urinary frequency.  He has had  arthritis, joint pain, depression, and nosebleeds.  All other systems  negative.   ALLERGIES:  None known.   PHYSICAL EXAM:  Blood pressure 153/82, heart rate 64, respirations 16.  He is, in general, a morbidly obese male patient who is in no apparent  distress,  alert and oriented x3.  Neck is supple.  3+ carotid pulses  palpable.  No bruits are audible.  Chest:  Clear to auscultation.  Abdomen is obese.  He has 3+ femoral pulses bilaterally.  He does have  palpable brachial and radial pulses 3+ bilaterally.  Cephalic veins were  examined and are marginal in size.  I ordered vein mapping and personal  reviewed and interpreted this.  It appears that the cephalic vein in the  left arm is  slightly better than the right, particularly in the upper  arm.   We will attempt to create a fistula in this gentleman but it may well be  unsuccessful as I discussed with the family because of vein size.  I  have scheduled him for Tuesday, November 16th, at his request for  attempted left forearm fistula with possible left upper arm fistula by  Dr. Darrick Penna.  They understand that this may well be best for dialysis  purposes.   Quita Skye Hart [place], M.D.  Electronically Signed   JDL/MEDQ  D:  12/19/2008  T:  12/20/2008  Job:  [id number]   cc:   Zetta Bills, MD

## 2010-07-02 NOTE — Procedures (Signed)
CEPHALIC VEIN MAPPING   INDICATION:  Evaluate for AV fistula.   HISTORY:  Renal failure.   EXAM:   The right cephalic vein is compressible.   Diameter measurements range from 0.13 to 0.43 cm.   The left cephalic vein is compressible.   Diameter measurements range from 0.20 to 0.37 cm.   See attached worksheet for all measurements.   IMPRESSION:  1. Patent bilateral cephalic veins which are of acceptable diameter      for use as a dialysis access site.  2. The right cephalic is not well visualized above the antecubital      fossa level.   ___________________________________________  Quita Skye. Hart [place], M.D.   CB/MEDQ  D:  12/19/2008  T:  12/19/2008  Job:  [id number]

## 2010-07-02 NOTE — Discharge Summary (Signed)
NAMEKENYAN, KARNES NO.:  [id number]   MEDICAL RECORD NO.:  [id number]          PATIENT TYPE:  INP   LOCATION:  3733                         FACILITY:  [facility]   PHYSICIAN:  Ladell Pier, M.D.   DATE OF BIRTH:  1960-03-26   DATE OF ADMISSION:  01/19/2007  DATE OF DISCHARGE:  01/21/2007                               DISCHARGE SUMMARY   DISCHARGE DIAGNOSES:  1. Chest pain negative cardiac enzymes.  The patient to follow up for      results of 2-D echo with primary care doctor, 2-D echo results      pending on discharge  2. Hypertensive urgency.  3. Hypertension.  4. Obesity.  5. History of cerebrovascular accident in 2007.  6. Dyslipidemia.  7. Question of borderline diabetes.  8. Chronic renal insufficiency.   DISCHARGE MEDICATIONS:  1. Aspirin 81 mg daily.  2. Norvasc 10 mg daily.  3. Clonidine 0.2 mg t.i.d.  4. Lasix 20 mg b.i.d.  5. Hydralazine 10 mg t.i.d.  6. Benicar 40 mg daily.  7. Coreg 40 mg daily.  8. Vytorin nightly.   FOLLOW UP APPOINTMENT:  The patient to follow up with primary care  doctor, Dr. Barbee Shropshire, in one week.   PROCEDURES:  None.   CONSULTANT:  None.   HISTORY OF PRESENT ILLNESS:  The patient is a 50 year old African  American male with poorly controlled hypertension who reported the  hospital with four days of midsternal chest pain.  He stated it lasted  for several days.  Pain felt like it was shooting through his spine,  felt like something was stabbing him in his back.  He had known  diaphoresis.  The patient came in.  His pain was totally gone when he  came in.  He was taking Motrin for the pain.  He was told before that he  should not take Motrin.  He had some shortness of breath but no nausea.   Past medical history, family history, social history, medications,  allergies, and review of systems per admission H&P.   PHYSICAL EXAMINATION ON DISCHARGE:  VITAL SIGNS:  Temperature 97.5,  pulse 66, respirations 20, blood  pressure 146/81, pulse ox 99% on 2  liters nasal cannula.  HEENT:  Normocephalic, atraumatic.  Pupils reactive to light.  Throat  without erythema.  CARDIOVASCULAR:  Regular rate and rhythm, 2/6 systolic murmur.  LUNGS:  Clear bilaterally.  ABDOMEN:  Positive bowel sounds.  EXTREMITIES:  Without edema.   HOSPITAL COURSE:  PROBLEM #1 -  HYPERTENSIVE URGENCY:  The patient was  admitted to the hospital, placed on medications for blood pressure.  At  one time, his blood pressure was severely elevated and he was going to  be transferred to the step-down unit for drip, but blood pressure  improved and he was not transferred to the unit.  Then blood pressure  had gone up to 210/110.  With adjustment of his p.o. medications, on the  day of discharge his blood pressure 146/81.  He was scheduled to have  MRI done to rule out aortic dissection with his blood pressure  being  very elevated and the chest pain, but he was unable to do the MRI  because he could not sit in the machine.  He could not get a CT  angiogram of the chest because of his kidney function and he was also  too large for that machine.  He had a 2-D echo done today and the  results still pending.  Will follow up on that and also get patient's  primary care physician to follow up on the results of the 2-D echo.   PROBLEM #2 -  HYPERTENSION:  Blood pressure much better prior to  discharge.  The patient wants to go home today.  He will follow up with  his primary care physician.   PROBLEM #3 -  CHRONIC RENAL INSUFFICIENCY:  The patient has a history of  chronic renal insufficiency, baseline creatinine 1.9.  On discharge his  creatinine was to 2.02.   PROBLEM #4 -  QUESTION OF BORDERLINE DIABETES:  The patient stated that  he has borderline diabetes.  His hemoglobin A1c is pending on discharge.   PROBLEM #5 -  HYPERLIPIDEMIA:  He will continue the Vytorin on  discharge.   PROBLEM #6 -  OBESITY:  Discussed with him diet and  exercise.  He will  follow up in the outpatient clinic for further management of his  obesity.   PROBLEM #7 -  MILD ANEMIA:  He has mild anemia probably secondary to his  kidney functions:  He will follow up as an outpatient with his primary  care physician for further  workup of his mild decrease in hemoglobin.   DISCHARGE LABORATORY DATA:  Sodium 133, potassium 4.3, chloride 101, CO2  25, glucose 118, BUN 20, creatinine 2.02.  WBC 11.7, hemoglobin 11.4,  platelets 338.  TSH 2.639.  Lipid profile with total cholesterol 173,  triglycerides 134, HDL 46, LDL 100.      Ladell Pier, M.D.  Electronically Signed     NJ/MEDQ  D:  01/21/2007  T:  01/21/2007  Job:  [id number]   cc:   Olene Craven, M.D.

## 2010-07-05 NOTE — Discharge Summary (Signed)
Gabriel Bell, GRATTAN NO.:  [id number]   MEDICAL RECORD NO.:  [id number]          PATIENT TYPE:  INP   LOCATION:  1415                         FACILITY[facility]   PHYSICIAN:  Marcellus Scott, MD     DATE OF BIRTH:  03-03-1960   DATE OF ADMISSION:  01/03/2006  DATE OF DISCHARGE:  01/06/2006                               DISCHARGE SUMMARY   PRIMARY CARE PHYSICIAN:  Olene Craven, M.D.   ADDENDUM:  This is an addendum summary which will only outline the  events in this patient's inpatient care since yesterday, January 05, 2006.  Please refer to my interim discharge summary done on January 05, 2006, for discharge diagnoses, procedures done, pertinent lab data,  consultations obtained, as well as hospital course.   DISCHARGE MEDICATIONS:  1. Aspirin 325 mg p.o. daily.  2. Coreg 12.5 mg p.o. twice daily.  3. Lisinopril 10 mg p.o. daily.  4. Vytorin 10/40 one tablet p.o. daily.  5. Cardizem CD 360 mg p.o. daily.  6. Foltx one p.o. daily.  7. Norvasc 10 mg p.o. daily.   The patient has basically been asymptomatic since yesterday, however,  very anxious and agitated that he is still in the hospital and has been  repeatedly indicating to the nursing staff as well as myself that he  wants to be discharged.  At times, he has pulled out his monitor wires  and ready to leave.  Today, there have been fluctuations in his blood  pressure and even yesterday. This morning at 6 a.m. the blood pressure  was 215/112 and most recent one was 154/94.  His blood pressures also  seem to be elevated when he is anxious and agitated.  The patient has  been advised to stay so that we can further monitor and aim to control  his blood pressure better.  However, the patient indicates that he will  not stay any further.  He would like to go home and continue to take his  p.o. medications.   The patient's diagnoses, prognosis, current management, treatment  options, as well as  detrimental effects of noncompliance with  medications have been clearly explained to him as well as his spouse at  the bedside.  The patient and his spouse verbalize understanding and  claim that they will be compliant with medications and will be compliant  with follow-up with the physicians indicated.  The patient is to be  discharged on the antihypertensive medications above.  Norvasc is being  added today after discussion with cardiology who are also okay for him  to be discharged and follow up at their office next week.  An  echocardiogram has been done which basically  indicates left ventricle  to be dilated.  Overall left ventricular systolic function at lower  limits of normal.  LV ejection fraction at 50%.  Moderate hypokinesis of  the basal mid posterior lateral wall.  Left ventricular wall thickness  mildly increased.  Trivial aortic valve regurgitation.  Left atrial size  is upper limits of normal.  The workup for secondary causes of  hypertension including his 24-hour urine, catecholamines, and serum  renins are still pending.  This has to be followed up as an outpatient.  Also the patient has proteinuria, history of positive ANA, high blood  pressure, and chronic kidney disease as indicated in the interim  discharge summary.  Further workup as an outpatient.  The patient  and spouse have also been advised to return to the emergency room if  there is any recurrence or worsening of symptoms at any point.  An  appointment has been made for him to see the physicians indicated.   Consultations were as per the interim discharge summary.      Marcellus Scott, MD  Electronically Signed     AH/MEDQ  D:  01/06/2006  T:  01/06/2006  Job:  [id number]   cc:   Olene Craven, M.D.  Fax: [phone]   Cristy Hilts. Jacinto Halim, MD  Fax: [phone]   Pramod P. Pearlean Brownie, MD  Fax: [phone]

## 2010-07-05 NOTE — Consult Note (Signed)
NAME:  Gabriel Bell, Gabriel Bell NO.:  [id number]   MEDICAL RECORD NO.:  [id number]          PATIENT TYPE:  EMS   LOCATION:  MAJO                         FACILITY:  [facility]   PHYSICIAN:  Pramod P. Pearlean Brownie, MD    DATE OF BIRTH:  1960-08-26   DATE OF CONSULTATION:  DATE OF DISCHARGE:                                   CONSULTATION   REFERRING PHYSICIAN:  Nelva Nay, M.D.   REASON FOR REFERRAL:  Code Stroke.   HISTORY OF PRESENT ILLNESS:  Gabriel Bell is a 50 year old African American  gentleman who developed sudden onset of dizziness, blurred vision, gait  instability at about midnight earlier today.  He states his symptoms have  persisted.  At times he has had some slurred speech as well as some blurred  and double vision.  He has been leaning to the left while walking.  He went  to Urgent Care today where his blood pressure was found to be significantly  elevated and hence he was referred to [facility] for urgent CT scan and  stroke evaluation.  The patient denies any focal extremity weakness or  numbness.  There is no prior known history of stroke, TIA, seizures or  significant neurological problems.   PAST MEDICAL HISTORY:  1. Significant for hypertension.  2. Hyperlipidemia.  3. Obesity.  4. Diabetes.   FAMILY HISTORY:  Not significant for neurological problems of stroke.   SURGICAL HISTORY:  None.   SOCIAL HISTORY:  The patient lives at home with his wife.  He does not smoke  or abuse drugs.  He drinks alcohol only occasionally.   MEDICATIONS:  List not available at present.   ALLERGIES TO MEDICATIONS:  None.   REVIEW OF SYSTEMS:  Positive for headache, blurred vision and dizziness,  speech difficulties.  No chest pain, shortness of breath, cough or diarrhea.   PHYSICAL EXAM:  Reveals obese young [place] American gentleman who is not in  distress.  He is afebrile.  Pulse rate is 70 per minute regular, respiratory  16 per minute, blood pressure is 240/134.   Distal pulses are well felt.  Head is nontraumatic.  Neck is supple without bruit.  ENT exam unremarkable.  CARDIAC:  Regular heart sounds.  LUNGS:  Clear to auscultation.  ABDOMEN:  Soft, nontender.  NEUROLOGICAL EXAM:  The patient is awake, alert, oriented to time, place and  person.  There is no aphasia, apraxia or dysarthria.  Pupils are equal,  reactive.  Eye movements are full range without nystagmus.  Face is  symmetric.  Palatal movements are normal.  Tongue is midline.  Motor system  exam:  No upper extremity drift, symmetric strength, tone and reflexes.  Finger-to-nose coordination is slightly impaired on the right.  There is no  coordination, gait was not tested.   DATA REVIEWED:  CT scan of the head noncontrast study done today reveals no  acute abnormality nor hemorrhage or infarct are seen.  Admission labs are  pending at this time.   IMPRESSION:  A 50 year old gentleman with sudden onset of gait ataxia,  dizziness and blurred  vision in the setting of significantly elevated  hypertension.  This may represent hypertensive crisis though a small  posterior circulation brain stem infarct cannot be reliably ruled out.   PLAN:  I would recommend admission to the medical service.  Strict control  of hypertension.  Use IV labetalol p.r.n. and start Cardene drip to bring  blood pressure down to 180 systolic.  Check MRI scan of the brain, 2-D echo,  carotid ultrasound and fasting lipid profile, hemoglobin A1c, homocystine.  Physical, occupational therapy consults.  I would be happy to follow the  patient in consult and call for questions.           ______________________________  Sunny Schlein. Pearlean Brownie, MD     PPS/MEDQ  D:  01/02/2006  T:  01/03/2006  Job:  [id number]   cc:   Olene Craven, M.D.

## 2010-07-05 NOTE — Consult Note (Signed)
NAMESION, REINDERS NO.:  [id number]   MEDICAL RECORD NO.:  [id number]          PATIENT TYPE:  INP   LOCATION:  0154                         FACILITY:  [facility]   PHYSICIAN:  Gabriel Hilts. Jacinto Halim, MD       DATE OF BIRTH:  Oct 25, 1960   DATE OF CONSULTATION:  01/03/2006  DATE OF DISCHARGE:                                 CONSULTATION   REFERRING PHYSICIAN:  Marcellus Bell, M.D.   REASON FOR CONSULTATION:  Hypertensive emergency, abnormal cardiac  enzymes.   HISTORY OF PRESENT ILLNESS:  [name] is a 50 year old  gentleman with a history of known hypertension, obesity and  hyperlipidemia who was admitted to [facility] complaining of  blurred vision last evening and dizziness.  In the emergency room, he  was found to have blood pressure of 240/134 mmHg.  During this, he was  admitted as hypertensive emergency.   There is a suspicion for a new onset of posterior cerebral circulation  stroke secondary to hypertensive emergency.  He was initially admitted  to intensive care unit with IV Cardene and his blood pressure is now  much better controlled.  As a part of workup, cardiac enzymes were  performed and the troponins positive.  Hence, he was referred for  further cardiovascular risk stratification.   The patient denies any chest pain, denies shortness of breath, denies  any paroxysm nocturnal dyspnea or orthopnea.   REVIEW OF SYSTEMS:  He does have diabetes which is diet controlled.  He  has no bowel or bladder disturbances.  He has no neurological weakness.  At present, he denies any visual disturbances.  He has no symptoms of  claudication.  He does have mild symptoms of obstructive sleep apnea by  daytime somnolence and snoring and other systems are negative.   MEDICATIONS:  None at admission.  In the past, he was on  antihypertensive medication.   PAST MEDICAL HISTORY:  1. Significant for hypertension.  2. Diet controlled diabetes.  3.  Obesity.  4. History of chronic elevation of the liver enzymes thought to be      secondary to questionable statin use.  5. History of proteinuria.  6. History of positive ANA with negative ANCA and negative anti-DNA      antibody.  7. Fatty liver.  8. Horseshoe kidney.  9. History of chronic mild anemia.  10.History of chronic renal insufficiency.  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   PAST SURGICAL HISTORY:  None.   SOCIAL HISTORY:  He is married, lives with his wife.  Does not smoke  tobacco.  Does not use any illicit drugs.  Had smoked marijuana in the  remote past.  Does not use any cocaine.  Drinks alcohol on a social  basis.  He has a sedentary lifestyle.   FAMILY HISTORY:  There is no history of premature coronary artery  disease in the family, although his brother had a stroke at the age of  43 but was also significantly hypertensive.  His mother also has  hypertension but no diabetes.   PHYSICAL EXAMINATION:  He is well built, obese.  He appears to be in no  acute distress.  His vital signs include a heart rate of 78 beats per  minute, respiration was 16, blood pressure 161/83 mmHg.  CARDIAC EXAM:  S1-S2 was normal.  There was distant heart sounds.  There  was a 2/6 systolic ejection murmur heard in the aortic area and also  conducted bruit in the carotids.  CHEST EXAM:  Bilaterally clear breath sounds and no crackles.  ABDOMEN:  Obese.  No hepatosplenomegaly.  There are bowel sounds heard  in all four quadrants.  EXTREMITIES:  No edema.  Peripheral vascular exam was normal except for  conducted bruit in his carotids.  There was no radial femoral delay.   PERTINENT FINDINGS:  His EKG demonstrates sinus rhythm, left ventricular  hypertrophy with repolarization abnormality.   His cardiac markers are total CPK was 586 on admission, next set was 504  with a MB of 7.7 and 6.8 and index was normal at 1.3 both  sets.  His  troponin was 0.1 at admission and 0.09 this morning.   His total cholesterol was 315, triglycerides 151, HDL 54, LDL was 231.   His TSH is pending.   IMPRESSION:  1. Hypertensive emergency now resolved.  2. Diet controlled diabetes:  Hemoglobin A1c was normal this morning      at 5.8.  3. Abnormal EKG suggesting left ventricular hypertrophy with      repolarization abnormality.  Cannot exclude lateral wall ischemia.  4. Abnormal cardiac markers:  I suspect this is secondary to      hypertensive emergency and end-organ damage.  The index has been      negative for myocardial injury.  Only the troponin is minimally      elevated.  5. Acute on chronic diastolic heart failure.  6. Obesity.  7. History of chronic proteinuria and positive ANA antibodies.  8. Chronic renal insufficiency.  9. Noncompliance.  10.Symptoms suggestive of obstructive sleep apnea.  11.Abnormal heart sounds suggestive aortic valve sclerosis, cannot      exclude bicuspid aortic valve.   RECOMMENDATIONS:  The patient has now stabilized.  He does not have any  active signs of any heart failure.  He needs MRI of his head and we need  to wean him off of his Cardene.  I will switch him over from Norvasc to  Cardizem p.o. and add Coreg to his present regimen.  If his blood  pressure remains high, we can use hydralazine for p.r.n. use.   If his serum creatinine remains stable, he will benefit from addition of  ACE inhibitors.  Since admission, his creatinine has been stable at 2.1.  Hence, I will go ahead and start him on a low-dose of an ACE inhibitor  and this can be uptitrated.   Eventually, he will need further cardiovascular risk stratification and  this can be done in the outpatient basis.  He does need an  echocardiogram to evaluate his aortic valve and also his LV systolic  function.  He will need renal Duplex evaluation in the outpatient basis to evaluate for secondary cause of his  hypertension.   I will continue to make an update at this time and please do not  hesitate to call upon me if there are any further questions.   I did spend quite a considerable amount of time discussing regarding  compliance and follow-up.  Gabriel Hilts. Jacinto Halim, MD  Electronically Signed     JRG/MEDQ  D:  01/03/2006  T:  01/03/2006  Job:  [id number]   cc:   Gabriel Scott, MD   Olene Craven, M.D.  Fax: [phone]

## 2010-07-05 NOTE — Discharge Summary (Signed)
NAME:  Gabriel, Bell NO.:  [id number]   MEDICAL RECORD NO.:  [id number]          PATIENT TYPE:  INP   LOCATION:  1415                         FACILITY:  [facility]   PHYSICIAN:  Marcellus Scott, MD     DATE OF BIRTH:  [birth year]   DATE OF ADMISSION:  01/03/2006  DATE OF DISCHARGE:                         DISCHARGE SUMMARY - REFERRING   PRIMARY CARE PHYSICIAN:  Olene Craven, M.D.   DISCHARGE DIAGNOSES:  1. Hypertensive urgency.  2. Elevated troponins.  3. Chronic kidney disease.  4. Dyslipidemia.  5. Normocytic anemia.  6. Cerebrovascular accident.  7. Hypokalemia.  8. To rule out obstructive sleep apnea syndrome.   DISCHARGE MEDICATIONS:  To be finalized on actual discharge, but they  could be  1. Protonix 40 mg p.o. daily.  2. Aspirin 325 mg p.o. daily.  3. Coreg 12.5 mg p.o. b.i.d.  4. Lisinopril 10 mg p.o. daily.  5. Vytorin 10/40 one tablet p.o. daily.  6. Cardizem CD that is 360 mg p.o. daily.  7. Foltx one tablet p.o. daily.  8. Ambien 5 mg p.o. nightly p.r.n.  9. Tylenol 650 mg p.o. q.6h. p.r.n.   PROCEDURES:  1. January 04, 2006, MRI of the brain with contrast.  Impression:      Multiple small areas of acute infarct including the right      paramedian mid brain, left external capsule, and left frontal white      matter.  Chronic small vessel ischemic changes present in the white      matter and in the pons bilaterally.  Pituitary enlargement      suggestive of pituitary macroadenoma.  The radiologist suggested a      dedicated MRI of the pituitary gland with contrast.  2. January 04, 2006, MR angiography of the head.  Impression:      Negative intracranial MRA.  3. January 04, 2006, chest x-ray two view.  Impression:  Probable      mild interstitial edema.  Mild cardiomegaly.  4. January 03, 2006, renal ultrasound.  Impression:  Horse shoe      kidney.  Normal renal sites and no hydronephrosis identified.  5. January 02, 2006, CT of  the head without contrast.  Impression:      a.     Negative for bleed or other acute intracranial process.      b.     Extensive nonspecific white matter changes for age.      c.     Focal subacute or chronic infarct in the left external       capsule and right pons.  MR may be used for further       categorization.  6. Echocardiogram  which is pending.   PERTINENT LABORATORY DATA:  January 04, 2006, CBC with hemoglobin 11.3,  hematocrit 33.5, wbc 11.5, MCV 88.3 and white cell count 309.  On  January 05, 2006, sodium 139, potassium 3.9, chloride 110, bicarb 21,  glucose 112, BUN 24, creatinine 2, calcium 9.1.  His creatinine on  admission was 2.1.  Hemoglobin A1c 5.8.  Cholesterol 315, triglycerides  151, HDL 54, LDL 231, VLDL 30, total cholesterol/HDL radio 5.8.  Serial  cardiac enzymes with CK mildly elevated in the 400-500 range on January 03, 2006, CK 470, CK-MB 6.2, relative index 1.5 and troponin of 0.18.  TSH 2.853.  Homocystine is 18.  Urine toxicology screen was negative,  ethanol less than 5.  Urine creatinine 167.3, urine sodium 111, urine  potassium 34.  Urinalysis was positive for small amount of blood,  greater than 300 mg/dl of protein, negative for nitrates and leukocytes  and hyaline casts were seen and rare bacteria.   CONSULTATIONS:  1. Cardiology, Cristy Hilts. Jacinto Halim, MD.  2. Neurology, Dr. Pearlean Brownie.   HOSPITAL COURSE/CONDITION ON DISCHARGE:  For details of the initial part  of the admission, please refer to the above history and physical done by  Dr. Hillery Aldo on January 02, 2006.  In summary, [name] is a 50-  year-old [place] American male patient with history of hypertension,  noncompliant with medication for more than two weeks, diabetes, obesity,  proteinuria and other medical conditions as per the history and physical  note presented to the emergency room with history of headache, blurred  vision, generalized weakness and confusion.  He was evaluated by  the  neurologist for a presumed stroke.  CT of the head was as indicated  above.  Blood pressure in the ER was noted to be 240/134.  Neurologist  thought that patient, rather than having an acute stroke, was having  hypertensive encephalopathy secondary to his uncontrolled hypertension  and recommended medical admission with neurology on consult.  Patient  was subsequently admitted to the intensive care unit at [facility], was started on a Cardene drip and oral Norvasc with which the  blood pressures were better controlled.  However, within 12 hours  patient was awake, alert and oriented with no focal neurological  deficits and not complaining.  Patient was noted to have mildly elevated  troponins.  Cardiology was consulted.  Patient's IV Cardene and Norvasc  were discontinued after starting the patient on Cardizem CD, lisinopril  as well as Coreg and patient's blood pressures have been fairly  controlled with intermittent spikes with activity or with agitation.  The work-up for the secondary causes has been initiated.  His urine is  positive for proteinuria as well as a small amount of blood. Also  patient's 24-hour urine catecholamine is pending.  Further evaluation of  secondary causes for hypertension to be done as an outpatient.  It has  been stressed to the patient regarding compliance with his  antihypertensive and other medications and the risks of not being  compliant with medications and the detrimental of effect on his health  secondary to the same.  Patient and spouse verbalized understanding and  agree that the patient will be compliant with medications.   PROBLEM #2 -  ELEVATED TROPONINS:  Thought to be secondary to problem  #1.  Patient denied any chest pain ever.  His echocardiogram  is  pending.  Patient was to continue aspirin, Vytorin as well as Coreg and  to follow up with Cardiology.  PROBLEM #3 -  CHRONIC KIDNEY DISEASE:  Ultrasound findings are as   indicated above.  Urinalysis findings are as indicated above.  Patient  has been started on lisinopril in the hospital.  His creatinines have  remained stable, however, his basic metabolic panel is to be followed up  closely as an outpatient and also a renal consult might  be appropriate  as an outpatient to evaluate renal as a cause for his hypertension.   PROBLEM #4 -  DYSLIPIDEMIA:  Patient has been started on Vytorin and  liver function tests as well as muscle enzymes to be closely followed as  an outpatient.  Also patient advised to seek attention p.r.n. with  muscle weakness or muscle pains.   PROBLEM #5 -  NORMOCYTIC ANEMIA:  This has remained stable.  To be  worked up as an outpatient.   PROBLEM #6 -  NEW ONSET CEREBROVASCULAR ACCIDENT AS PER THE PRESENTATION  MRI AS ABOVE:  Neurology consult has been consulted and will follow up.  Aspirin to be continued.   PROBLEM #7 -  HYPOKALEMIA:  Has been repleted and corrected.   PROBLEM #8 -  HISTORY OF DIABETES WITH A1C AS INDICATED ABOVE:  To be  followed up as an outpatient, re starting oral medications or managed on  diet and exercise alone.   PROBLEM #9 -  RULE OUT OBSTRUCTIVE SLEEP APNEA SYNDROME:  Patient has a  history of snoring per his wife with no evidence of sleep apnea.  To  consider doing sleep study to rule out obstructive sleep apnea.   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   Patient has also had a nutrition assessment in the hospital and has been  advised regarding his diet.  To be referred to an outpatient nutrition  and diabetes center for further diabetes counseling.      Marcellus Scott, MD  Electronically Signed     AH/MEDQ  D:  01/05/2006  T:  01/05/2006  Job:  [id number]   cc:   Cristy Hilts. Jacinto Halim, MD  Fax: [phone]   Pramod P. Pearlean Brownie, MD  Fax: [phone]   Olene Craven, M.D.  Fax: [phone]

## 2010-07-05 NOTE — Consult Note (Signed)
[facility]  Patient:    Gabriel Bell, KE                      MRN: [id number] Adm. Date:  [id number] Attending:  Doneta Public                          Consultation Report  CONSULTING PHYSICIAN:  Estill Batten. Deirdre Priest, M.D.  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  HISTORY OF PRESENT ILLNESS:  This is a 50 year old gentleman with a long history of obesity; increased LFTs and a CK of about a year of unknown cause, initially it was thought to be secondary to Lipitor but it is not down over a year; history of proteinuria, about 800 mg, last year; history of positive ANA with a speckled pattern of 1:640 in August 2001, mild anemia at that time; history of a back injury in the past with multilevel disk disease; and obesity.  He is admitted at this time with greater than 24-hour of headache, chills with 101.7 temperature, abdominal pain which is primarily left flank pain and is spasmodic in nature.  He has had air-fluid levels on a KUB at an urgent care center.  He has had nausea but no vomiting.  He said he had some loose stools but that has not been present for the last couple of days.  He has no history of renal stones, hematuria.  No family history of renal disease.  No family history of urinary tract infection, hypertension, diabetes mellitus; but he does have a history of frequency and nocturia every 30 minutes to 1 hour.  History of frequent use of nonsteroidals in the recent past, up to 4-6 per day, and intermittently prior to that.  Also, he denies any rash, arthralgias, sores in his mouth or on his penis.  No history of shortness of breath or chest pain.  He denies alcohol use and no illicit drug use.  PAST MEDICAL HISTORY:  Back injury.  History of illnesses listed above.  MEDICATIONS:  The only medication he is taking are the nonsteroidals at the current time.  FAMILY HISTORY:  A sister with  diabetes.  His father died of heart disease at age 80.  His mother is healthy.  He has three children who are healthy.  He has family history of an uncle and a cousin with sickle cell disease.  Mother has hypertension and also diabetes.  ALLERGIES:  No known allergies.  SOCIAL HISTORY:  He does, however, now say that he does drink alcohol occasionally at social events.  He would not elaborate how much.  REVIEW OF SYSTEMS:  HEENT:  Diffuse global headaches x 38 hours and some photophobia.  No ear problems.  No sores in mouth or other problems there. SKIN:  Unremarkable.  MUSCULOSKELETAL:  He denies complaints at this time. CARDIOVASCULAR:  He has no PND, no orthopnea, no shortness of breath.  He says he may have some ankle edema but is not real sure about that.  GI:  He has had the nausea and he had loose stools but that is gone.  PULMONARY:  Negative. No asthma, cough, shortness of breath, or phlegm production.  GENERAL:  He has been weak.  He has been more tired than usual.  PHYSICAL EXAMINATION:  GENERAL:  He is anxious in no acute distress.  VITAL SIGNS:  Temperature 99.6, blood pressure 162/89.  HEENT:  No oral lesions.  Fundi are normal.  NECK:  No significant adenopathy.  No thyromegaly.  CARDIOVASCULAR:  Regular rhythm.  S4.  Grade 2/6 holosystolic murmur heard best at the apex.  PMI is 11 cm ______ fifth intercostal space.  Pulses are 2+/4+.  No bruits are noted.  He has trace edema.  ABDOMEN:  Positive bowel sounds.  The liver is down about 4 cm.  Bowel sounds are active.  Mild left flank tenderness but abdomen is soft overall.  EXTREMITIES:  Skin is dry in the lower extremities.  He has no rash at the current time.  No significant arthritis or joint effusions.  Full range of motion.  NEUROLOGIC:  Motor is 5/5 and symmetric.  Deep tendon reflexes are 1+/4+ and symmetric.  Toes are downgoing.  No drift.  Cranial nerves II-XII grossly intact.  LABORATORY DATA:   Sodium 133, potassium 4.1, chloride 99, bicarbonate 22, creatinine 1.1, BUN 7, glucose 169, SGOT 52, SGPT 91, albumin 3.5.  CK 774. Cholesterol 264.  Urinalysis:  300 mg percent protein, 11-20 red cells, 4-6 white cells.  CT:  Questionable horseshoe kidney.  He had an ultrasound in May 2000 which showed normal-sized kidneys without a mention of a horseshoe.  ASSESSMENT: 1. Proteinuria with hematuria and white cells in the setting of increased CK    and LFTs, history of positive CK, history of positive ANA, and increased    glucose.  First of all, we need to rule out infection.  Syndrome would seem    likely to fit connective tissue disease or vasculitis, i.e., SLE would be    the commonest of those but it is not a typical presentation.  The    chronicity of his symptoms and findings are not typical.  Still, it seems    fairly likely.  This may be mimicked by sepsis or other diseases or    multiple diseases including infection, hepatic ______ but a unifying    diagnosis seems much more likely.  We need to evaluate his proteinuria and    the question of the horseshoe kidney. 2. Fever, rule out infection. 3. Abdominal pain.  The etiology of this is not real clear. 4. Anemia. 5. Increased LFTs. 6. Increased CK.  PLAN: 1. A 24-hour urine. 2. ANA and double-stranded DNA. 3. C3 and C4. 4. Culture of his urine. 5. Ultrasound. DD:  07/23/00 TD:  07/24/00 Job: [id number]

## 2010-07-05 NOTE — H&P (Signed)
NAME:  Gabriel Bell, Gabriel Bell NO.:  [id number]   MEDICAL RECORD NO.:  [id number]          PATIENT TYPE:  EMS   LOCATION:  [facility]                         FACILITY:  [facility]   PHYSICIAN:  Hillery Aldo, M.D.   DATE OF BIRTH:  1960/10/14   DATE OF ADMISSION:  01/02/2006  DATE OF DISCHARGE:                                HISTORY & PHYSICAL   PRIMARY CARE PHYSICIAN:  Dr. Barbee Shropshire.   CHIEF COMPLAINT:  Headache, dizziness, blurred vision, and weakness  beginning last midnight.   HISTORY OF PRESENT ILLNESS:  The patient is a 50 year old male with a past  medical history of hypertension, diabetes, obesity, and poor compliance with  medication management who presents with a 20-hour history of bitemporal  headache, blurred vision, dizziness, generalized weakness, and periods of  confusion that began at midnight last night. The patient states that he  slept fitfully and then in the morning awoke to similar symptoms. [facility]  [facility] where an EKG was done, and vital signs  revealed a marked elevation in his blood pressure, some T wave elevation as  well which prompted the urgent care physician to transport him to the  emergency department for further evaluation and workup. The patient denies  any chest pain or shortness of breath. He does endorse diminished energy and  appetite. He has had some intermittent nausea accompanied with dizziness but  no vomiting. His initial blood pressure on presentation was 240/134. He is  therefore being admitted with hypertensive emergency for emergent control of  his blood pressure.   PAST MEDICAL HISTORY:  1. Hypertension not currently on any medications.  2. Diabetes currently not on any medications.  3. Obesity.  4. History of elevation of liver function studies thought to be secondary      to statin use.  5. Proteinuria.  6. History of positive ANA speckled pattern with a 1:640 titer (negative      ANCA and negative  anti-DNA antibody).  7. Fatty liver.  8. Horseshoe kidney.  9. History of mild anemia.  10.Multilevel disk disease.  11.History of pyelonephritis.  12.Hyperlipidemia.   FAMILY HISTORY:  The patient's father died of heart disease at 78. Mother is  alive with hypertension and diabetes.  He has one sister who is diabetic. He  had one brother who died at age 15 from a massive stroke. He has two other  brothers who are healthy. He has three healthy offspring.   SOCIAL HISTORY:  The patient is married and currently unemployed. In the  past he has worked at various odd jobs including [employer]. He is a  lifelong nonsmoker. He drinks on social occasions. Denies drugs.   ALLERGIES:  None.   MEDICATIONS:  Not taking any medications currently, but has been prescribed  an antihypertensive in the past.   REVIEW OF SYSTEMS:  Again the patient endorses decreased energy and  appetite. He has had intermittent fever and chills. He has a recent sore  throat. No shortness of breath. He has had a cough with occasional blood  tinged sputum. Denies chest  pain. Occasionally feels his heart is racing. He  has had nausea associated with his dizzy spells today but no vomiting. He  had some diarrhea last week, but bowels currently back to normal. Denies any  melena or hematochezia. No dysuria. He has had occasional side cramps. He  has had headache, dizziness, blurred vision as per HPI.   PHYSICAL EXAMINATION:  VITAL SIGNS:  Temperature 97.9, pulse 78,  respirations 20, blood pressure 240/134 currently down to 193/112.  GENERAL:  Obese male in no acute distress.  HEENT:  Normocephalic, atraumatic, PERRL, EOMI. Arcus senilis bilaterally.  Visual fields are full. Oropharynx clear with dentures to the upper palate.  NECK:  Supple, no thyromegaly, no lymphadenopathy, no jugular venous  distension. No bruits appreciable.  CHEST:  Lungs clear to auscultation bilaterally with diminished breath  sounds at  bases.  HEART:  Regular rate and rhythm. There is a grade 3/6 murmur at the left  upper sternal border.  ABDOMEN:  Soft, nontender, nondistended with  normoactive bowel sounds.  EXTREMITIES:  No clubbing, edema or cyanosis. There is xerosis bilaterally.  SKIN:  Warm and dry. No rashes.  NEUROLOGIC:  The patient is alert and oriented x3. Cranial nerves II-XII are  grossly intact.  EXTREMITIES:  He moves all extremities x4 with equal  strength.   DATA REVIEW:  CT scan of the head is negative for acute or other acute bleed  or other acute intracranial process.  He has extensive nonspecific white  matter changes for age, focal subacute or chronic infarct in the left  external capsule and right pons.   Sodium 136, potassium 3.1, chloride 106, bicarb 26, BUN 21, creatinine 2.1,  glucose 112.  Myoglobin is greater than 500. CK-MB 8.5, troponin I less than  0.05.   EKG shows T-wave inversions in V1, aVL, V5, V6, ST elevation in P3. This  finding is likely representative of left ventricular strain.   ASSESSMENT/PLAN:  1. Hypertensive emergency:  We will admit the patient to control his blood      pressure. I will admit him to the step-down unit given the need to      monitor him frequently with regard to vital signs and also because we      will be titrating a nitroprusside drip for blood pressure parameters. I      will also start him on p.o. Norvasc and labetalol p.r.n. As his blood      pressure comes down, we can add other oral antihypertensives to      optimally control his blood pressure. We will need extensive teaching      with regard to the importance of adhering to his medical management.      Certainly with his evidence of cerebrovascular disease and infarcts in      the left external capsule and left pons, I think it would be prudent to      do an MRI/MRA scan of the brain and start him on aspirin therapy for      stroke prevention. 2. Cerebrovascular disease:  Again the patient  has evidence of lacunar      infarcts. We will start him on aspirin and obtain an MRI/MRA of the      brain as well as carotid Dopplers. Given that there are likely other      risk factors, we will attempt to optimally control these by doing      diagnostic testing and initiating risk control interventions.  3. Hypokalemia:  Replete the patient's potassium.  4. Acute renal insufficiency:  The patient likely has a background of      chronic renal insufficiency.  Unclear what baseline creatinine is. We      will monitor this closely.  5. History of diabetes:  The patient is not currently on any antidiabetic      medications. I will check hemoglobin A1c and initiate sliding scale      insulin as needed.  6. Obesity:  Will initiate dietary counseling.  7. Hyperlipidemia:  Will check fasting lipid panel and consider putting      him on lipid control therapy. He has been intolerant to statins in the      past with elevated liver function studies.  8. Prophylaxis:  Initiate GI prophylaxis with Protonix and deep venous      thrombosis prophylaxis with Lovenox.      Hillery Aldo, M.D.  Electronically Signed     CR/MEDQ  D:  01/02/2006  T:  01/03/2006  Job:  [id number]   cc:   Olene Craven, M.D.

## 2010-07-05 NOTE — Discharge Summary (Signed)
NAME:  Gabriel Bell, Gabriel Bell NO.:  [id number]   MEDICAL RECORD NO.:  [id number]            PATIENT TYPE:   LOCATION:                                 FACILITY:   PHYSICIAN:  Marcellus Scott, MD     DATE OF BIRTH:  Jul 15, 1960   DATE OF ADMISSION:  01/02/2006  DATE OF DISCHARGE:                               DISCHARGE SUMMARY   DATE OF DISCHARGE:  Pending.   PRIMARY CARE PHYSICIAN:  Dr. Barbee Shropshire.   DISCHARGE DIAGNOSES:  1. Hypertensive urgency.  2. Elevated troponins.  3. Chronic kidney disease.  4. Dyslipidemia.  5. Normocytic anemia.  6. Cerebrovascular accident.  7. Hypokalemia.   Dictation Ended At [employer].      Marcellus Scott, MD     AH/MEDQ  D:  01/05/2006  T:  01/05/2006  Job:  [id number]

## 2010-07-05 NOTE — Discharge Summary (Signed)
[place]. [facility]  Patient:    Gabriel Bell, Gabriel Bell                      MRN: [id number] Adm. Date:  [id number] Disc. Date: [id number] Attending:  Doneta Public Dictator:   Kevin Fenton, M.D. CC:         Llana Aliment. Deterding, M.D.  Robert P. Merla Riches, M.D.   Discharge Summary  DISCHARGE MEDICATIONS: 1. Bactrim DS one tablet p.o. b.i.d. x 10 days. 2. Metoprolol 50 mg p.o. b.i.d. 3. Hydrochlorothiazide 25 mg p.o. q.d. 4. Lisinopril 5 mg p.o. q.d.  DIET:  Low fat, no concentrated sweets diet.  ACTIVITY:  He is to walk briskly three to four times a week.  FOLLOWUP:  He is to see Dr. Darrick Penna at [facility] on Thursday, June 27, at 11 a.m.  He is to call Dr. Merla Riches to set up an appointment to be seen in two weeks.  DISCHARGE DIAGNOSES: 1. Pyelonephritis. 2. Proteinuria. 3. Hyperlipidemia with increased creatine phosphokinase on Lipitor,    discontinued in June 2001. 4. Hypertension. 5. Diabetes. 6. Positive antinuclear antibody.  HISTORY OF PRESENT ILLNESS:  This is a 50 year old, African-American male with a significant past medical history for hyperlipidemia and elevated LFTs of unclear etiology who presented with abdominal pain.  He also presented with headache, fevers and chills as well as increased white count, air fluid levels on KUB and impressive abdominal tenderness.  HOSPITAL COURSE:  The patient was admitted and found to have pyelonephritis secondary to E. coli which was pansensitive.  He was started on Tequin and defervesced very well.  The patient was also noted to be hypertensive and not on any medications.  He was started on metoprolol and hydrochlorothiazide without any difficulties.  As the patient has a history of a positive ANA as well as proteinuria, a 24-hour urine was obtained.  He was found to have 7500 mg of protein in 24 hours.  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  Dr. Darrick Penna ordered C3-C4, ANA, double strength DNA and a sedimentation rate to evaluate the patient for lupus nephritis.  Those evaluations are pending at discharge.  He also requested a renal ultrasound as well as cryoglobulins.  The patient had an abdominal CT scan that showed a fatty liver as well as a horseshoe kidney. The patient was also noted to be diabetic during this admission, however, no medications were started at this time.  Hemoglobin A1C was not completed and this will be recommended to follow up as an outpatient.  The patient will need more aggressive management of his lipids.  He was seen here by nutrition here in the hospital and given instruction on a low-fat diet.  However, as the patient has had increased CPK in the past on Statins, these may not be the best choice and I will leave this to his primary care doctor to evaluate this further.  The patient was discharged home at a discharge weight of 268 pounds. D:  07/27/00 TD:  07/27/00 Job: [id number]

## 2010-07-14 ENCOUNTER — Emergency Department (HOSPITAL_COMMUNITY): Payer: 59

## 2010-07-14 ENCOUNTER — Emergency Department (HOSPITAL_COMMUNITY)
Admission: EM | Admit: 2010-07-14 | Discharge: 2010-07-14 | Disposition: A | Discharge status: Home / Self Care | Payer: 59 | Attending: Emergency Medicine | Admitting: Emergency Medicine

## 2010-07-14 DIAGNOSIS — Q638 Other specified congenital malformations of kidney: Secondary | ICD-10-CM | POA: Insufficient documentation

## 2010-07-14 DIAGNOSIS — N186 End stage renal disease: Secondary | ICD-10-CM | POA: Insufficient documentation

## 2010-07-14 DIAGNOSIS — R110 Nausea: Secondary | ICD-10-CM | POA: Insufficient documentation

## 2010-07-14 DIAGNOSIS — R112 Nausea with vomiting, unspecified: Secondary | ICD-10-CM | POA: Insufficient documentation

## 2010-07-14 DIAGNOSIS — Z8679 Personal history of other diseases of the circulatory system: Secondary | ICD-10-CM | POA: Insufficient documentation

## 2010-07-14 DIAGNOSIS — R109 Unspecified abdominal pain: Secondary | ICD-10-CM | POA: Insufficient documentation

## 2010-07-14 DIAGNOSIS — R197 Diarrhea, unspecified: Secondary | ICD-10-CM | POA: Insufficient documentation

## 2010-07-14 DIAGNOSIS — I120 Hypertensive chronic kidney disease with stage 5 chronic kidney disease or end stage renal disease: Secondary | ICD-10-CM | POA: Insufficient documentation

## 2010-07-14 DIAGNOSIS — Z992 Dependence on renal dialysis: Secondary | ICD-10-CM | POA: Insufficient documentation

## 2010-07-14 LAB — DIFFERENTIAL
Blasts: 0 %
Eosinophils Absolute: 0.5 10*3/uL (ref 0.0–0.7)
Metamyelocytes Relative: 0 %
Myelocytes: 0 %
Neutro Abs: 10.1 10*3/uL — ABNORMAL HIGH (ref 1.7–7.7)
Neutrophils Relative %: 78 % — ABNORMAL HIGH (ref 43–77)
Promyelocytes Absolute: 0 %
nRBC: 0 /100 WBC

## 2010-07-14 LAB — LIPASE, BLOOD: Lipase: 88 U/L — ABNORMAL HIGH (ref 11–59)

## 2010-07-14 LAB — CBC
HCT: 34.7 % — ABNORMAL LOW (ref 39.0–52.0)
Hemoglobin: 11.4 g/dL — ABNORMAL LOW (ref 13.0–17.0)
MCH: 31.2 pg (ref 26.0–34.0)
MCHC: 32.9 g/dL (ref 30.0–36.0)
MCV: 95.1 fL (ref 78.0–100.0)
RBC: 3.65 MIL/uL — ABNORMAL LOW (ref 4.22–5.81)

## 2010-07-14 LAB — COMPREHENSIVE METABOLIC PANEL
AST: 18 U/L (ref 0–37)
CO2: 26 mEq/L (ref 19–32)
Chloride: 87 mEq/L — ABNORMAL LOW (ref 96–112)
Creatinine, Ser: 8.56 mg/dL — ABNORMAL HIGH (ref 0.4–1.5)
GFR calc Af Amer: 8 mL/min — ABNORMAL LOW (ref >60)
GFR calc non Af Amer: 7 mL/min — ABNORMAL LOW (ref >60)
Glucose, Bld: 146 mg/dL — ABNORMAL HIGH (ref 70–99)
Total Bilirubin: 0.2 mg/dL — ABNORMAL LOW (ref 0.3–1.2)

## 2010-07-14 MED ORDER — IOHEXOL 300 MG/ML  SOLN
100.0000 mL | Freq: Once | INTRAMUSCULAR | Status: AC | PRN
  Administered 2010-07-14: 100 mL via INTRAVENOUS

## 2010-07-16 ENCOUNTER — Emergency Department (HOSPITAL_COMMUNITY): Payer: 59

## 2010-07-16 ENCOUNTER — Emergency Department (HOSPITAL_COMMUNITY)
Admission: EM | Admit: 2010-07-16 | Discharge: 2010-07-16 | Disposition: A | Discharge status: Home / Self Care | Payer: 59 | Attending: Emergency Medicine | Admitting: Emergency Medicine

## 2010-07-16 DIAGNOSIS — I120 Hypertensive chronic kidney disease with stage 5 chronic kidney disease or end stage renal disease: Secondary | ICD-10-CM | POA: Insufficient documentation

## 2010-07-16 DIAGNOSIS — Z7982 Long term (current) use of aspirin: Secondary | ICD-10-CM | POA: Insufficient documentation

## 2010-07-16 DIAGNOSIS — Z8673 Personal history of transient ischemic attack (TIA), and cerebral infarction without residual deficits: Secondary | ICD-10-CM | POA: Insufficient documentation

## 2010-07-16 DIAGNOSIS — Z79899 Other long term (current) drug therapy: Secondary | ICD-10-CM | POA: Insufficient documentation

## 2010-07-16 DIAGNOSIS — R112 Nausea with vomiting, unspecified: Secondary | ICD-10-CM | POA: Insufficient documentation

## 2010-07-16 DIAGNOSIS — N186 End stage renal disease: Secondary | ICD-10-CM | POA: Insufficient documentation

## 2010-07-16 DIAGNOSIS — R109 Unspecified abdominal pain: Secondary | ICD-10-CM | POA: Insufficient documentation

## 2010-07-16 DIAGNOSIS — K859 Acute pancreatitis without necrosis or infection, unspecified: Secondary | ICD-10-CM | POA: Insufficient documentation

## 2010-07-16 DIAGNOSIS — Z992 Dependence on renal dialysis: Secondary | ICD-10-CM | POA: Insufficient documentation

## 2010-07-16 LAB — DIFFERENTIAL
Basophils Absolute: 0.1 10*3/uL (ref 0.0–0.1)
Basophils Relative: 1 % (ref 0–1)
Eosinophils Absolute: 0.3 10*3/uL (ref 0.0–0.7)
Monocytes Absolute: 1.8 10*3/uL — ABNORMAL HIGH (ref 0.1–1.0)
Neutro Abs: 5.9 10*3/uL (ref 1.7–7.7)
Neutrophils Relative %: 53 % (ref 43–77)

## 2010-07-16 LAB — CBC
MCH: 31.1 pg (ref 26.0–34.0)
MCHC: 32.9 g/dL (ref 30.0–36.0)
Platelets: 344 10*3/uL (ref 150–400)

## 2010-07-16 LAB — COMPREHENSIVE METABOLIC PANEL
AST: 17 U/L (ref 0–37)
Albumin: 3.6 g/dL (ref 3.5–5.2)
Calcium: 10.2 mg/dL (ref 8.4–10.5)
Creatinine, Ser: 8.69 mg/dL — ABNORMAL HIGH (ref 0.4–1.5)
GFR calc Af Amer: 8 mL/min — ABNORMAL LOW (ref >60)
Total Protein: 7.5 g/dL (ref 6.0–8.3)

## 2010-07-19 ENCOUNTER — Other Ambulatory Visit (HOSPITAL_COMMUNITY): Payer: Self-pay | Admitting: Nephrology

## 2010-07-19 DIAGNOSIS — N186 End stage renal disease: Secondary | ICD-10-CM

## 2010-07-25 ENCOUNTER — Other Ambulatory Visit: Payer: Self-pay | Admitting: Nephrology

## 2010-07-25 ENCOUNTER — Ambulatory Visit (HOSPITAL_COMMUNITY)
Admission: RE | Admit: 2010-07-25 | Discharge: 2010-07-25 | Disposition: A | Discharge status: Home / Self Care | Payer: 59 | Source: Ambulatory Visit | Attending: Nephrology | Admitting: Nephrology

## 2010-07-25 DIAGNOSIS — D649 Anemia, unspecified: Secondary | ICD-10-CM | POA: Insufficient documentation

## 2010-07-25 DIAGNOSIS — E119 Type 2 diabetes mellitus without complications: Secondary | ICD-10-CM | POA: Insufficient documentation

## 2010-07-25 DIAGNOSIS — E785 Hyperlipidemia, unspecified: Secondary | ICD-10-CM | POA: Insufficient documentation

## 2010-07-25 DIAGNOSIS — Z8673 Personal history of transient ischemic attack (TIA), and cerebral infarction without residual deficits: Secondary | ICD-10-CM | POA: Insufficient documentation

## 2010-07-25 DIAGNOSIS — F319 Bipolar disorder, unspecified: Secondary | ICD-10-CM | POA: Insufficient documentation

## 2010-07-25 DIAGNOSIS — N186 End stage renal disease: Secondary | ICD-10-CM | POA: Insufficient documentation

## 2010-07-25 DIAGNOSIS — E669 Obesity, unspecified: Secondary | ICD-10-CM | POA: Insufficient documentation

## 2010-07-25 DIAGNOSIS — I120 Hypertensive chronic kidney disease with stage 5 chronic kidney disease or end stage renal disease: Secondary | ICD-10-CM | POA: Insufficient documentation

## 2010-07-25 DIAGNOSIS — I5032 Chronic diastolic (congestive) heart failure: Secondary | ICD-10-CM | POA: Insufficient documentation

## 2010-07-25 MED ORDER — IOHEXOL 300 MG/ML  SOLN
100.0000 mL | Freq: Once | INTRAMUSCULAR | Status: AC | PRN
  Administered 2010-07-25: 40 mL via INTRAVENOUS

## 2010-07-30 ENCOUNTER — Other Ambulatory Visit: Payer: 59

## 2010-08-06 ENCOUNTER — Ambulatory Visit
Admission: RE | Admit: 2010-08-06 | Discharge: 2010-08-06 | Disposition: A | Discharge status: Home / Self Care | Payer: 59 | Source: Ambulatory Visit | Attending: Nephrology | Admitting: Nephrology

## 2010-10-13 ENCOUNTER — Emergency Department (HOSPITAL_COMMUNITY): Payer: 59

## 2010-10-13 ENCOUNTER — Emergency Department (HOSPITAL_COMMUNITY)
Admission: EM | Admit: 2010-10-13 | Discharge: 2010-10-13 | Disposition: A | Discharge status: Home / Self Care | Payer: 59 | Attending: Emergency Medicine | Admitting: Emergency Medicine

## 2010-10-13 DIAGNOSIS — Z8679 Personal history of other diseases of the circulatory system: Secondary | ICD-10-CM | POA: Insufficient documentation

## 2010-10-13 DIAGNOSIS — N186 End stage renal disease: Secondary | ICD-10-CM | POA: Insufficient documentation

## 2010-10-13 DIAGNOSIS — M25559 Pain in unspecified hip: Secondary | ICD-10-CM | POA: Insufficient documentation

## 2010-10-13 DIAGNOSIS — M7989 Other specified soft tissue disorders: Secondary | ICD-10-CM | POA: Insufficient documentation

## 2010-10-13 DIAGNOSIS — R42 Dizziness and giddiness: Secondary | ICD-10-CM | POA: Insufficient documentation

## 2010-10-13 DIAGNOSIS — I120 Hypertensive chronic kidney disease with stage 5 chronic kidney disease or end stage renal disease: Secondary | ICD-10-CM | POA: Insufficient documentation

## 2010-10-13 DIAGNOSIS — Z992 Dependence on renal dialysis: Secondary | ICD-10-CM | POA: Insufficient documentation

## 2010-10-13 DIAGNOSIS — M76899 Other specified enthesopathies of unspecified lower limb, excluding foot: Secondary | ICD-10-CM | POA: Insufficient documentation

## 2010-10-13 LAB — CBC
HCT: 28.7 % — ABNORMAL LOW (ref 39.0–52.0)
RDW: 15.3 % (ref 11.5–15.5)
WBC: 11.3 10*3/uL — ABNORMAL HIGH (ref 4.0–10.5)

## 2010-10-13 LAB — POCT I-STAT, CHEM 8
BUN: 30 mg/dL — ABNORMAL HIGH (ref 6–23)
Chloride: 97 mEq/L (ref 96–112)
Creatinine, Ser: 9.1 mg/dL — ABNORMAL HIGH (ref 0.50–1.35)
Glucose, Bld: 120 mg/dL — ABNORMAL HIGH (ref 70–99)
HCT: 31 % — ABNORMAL LOW (ref 39.0–52.0)
Potassium: 4 mEq/L (ref 3.5–5.1)

## 2010-10-13 LAB — DIFFERENTIAL
Basophils Relative: 0 % (ref 0–1)
Eosinophils Absolute: 0.5 10*3/uL (ref 0.0–0.7)
Eosinophils Relative: 4 % (ref 0–5)
Lymphocytes Relative: 32 % (ref 12–46)
Neutrophils Relative %: 55 % (ref 43–77)

## 2010-10-26 ENCOUNTER — Inpatient Hospital Stay (INDEPENDENT_AMBULATORY_CARE_PROVIDER_SITE_OTHER)
Admission: RE | Admit: 2010-10-26 | Discharge: 2010-10-26 | Disposition: A | Discharge status: Home / Self Care | Payer: 59 | Source: Ambulatory Visit | Attending: Emergency Medicine | Admitting: Emergency Medicine

## 2010-10-26 DIAGNOSIS — L708 Other acne: Secondary | ICD-10-CM

## 2010-10-26 DIAGNOSIS — L089 Local infection of the skin and subcutaneous tissue, unspecified: Secondary | ICD-10-CM

## 2010-11-25 ENCOUNTER — Emergency Department (HOSPITAL_COMMUNITY): Payer: 59

## 2010-11-25 ENCOUNTER — Emergency Department (HOSPITAL_COMMUNITY)
Admission: EM | Admit: 2010-11-25 | Discharge: 2010-11-25 | Disposition: A | Discharge status: Home / Self Care | Payer: 59 | Attending: Emergency Medicine | Admitting: Emergency Medicine

## 2010-11-25 ENCOUNTER — Encounter (HOSPITAL_COMMUNITY): Payer: Self-pay | Admitting: Radiology

## 2010-11-25 DIAGNOSIS — Z992 Dependence on renal dialysis: Secondary | ICD-10-CM | POA: Insufficient documentation

## 2010-11-25 DIAGNOSIS — N186 End stage renal disease: Secondary | ICD-10-CM | POA: Insufficient documentation

## 2010-11-25 DIAGNOSIS — I120 Hypertensive chronic kidney disease with stage 5 chronic kidney disease or end stage renal disease: Secondary | ICD-10-CM | POA: Insufficient documentation

## 2010-11-25 DIAGNOSIS — R29898 Other symptoms and signs involving the musculoskeletal system: Secondary | ICD-10-CM | POA: Insufficient documentation

## 2010-11-25 DIAGNOSIS — I440 Atrioventricular block, first degree: Secondary | ICD-10-CM | POA: Insufficient documentation

## 2010-11-25 DIAGNOSIS — G319 Degenerative disease of nervous system, unspecified: Secondary | ICD-10-CM | POA: Insufficient documentation

## 2010-11-25 DIAGNOSIS — W19XXXA Unspecified fall, initial encounter: Secondary | ICD-10-CM | POA: Insufficient documentation

## 2010-11-25 DIAGNOSIS — N390 Urinary tract infection, site not specified: Secondary | ICD-10-CM | POA: Insufficient documentation

## 2010-11-25 HISTORY — DX: Cerebral infarction, unspecified: I63.9

## 2010-11-25 HISTORY — DX: Essential (primary) hypertension: I10

## 2010-11-25 LAB — CBC
HCT: 30.6 % — ABNORMAL LOW (ref 39.0–52.0)
HCT: 33.4 — ABNORMAL LOW
HCT: 35.7 — ABNORMAL LOW
Hemoglobin: 10.3 g/dL — ABNORMAL LOW (ref 13.0–17.0)
Hemoglobin: 11.4 — ABNORMAL LOW
Hemoglobin: 12.1 — ABNORMAL LOW
MCHC: 33.7 g/dL (ref 30.0–36.0)
MCHC: 33.8
MCHC: 34.3
Platelets: 328
Platelets: 338
Platelets: 331
RBC: 3.18 MIL/uL — ABNORMAL LOW (ref 4.22–5.81)
RDW: 13.9
RDW: 14.1
RDW: 14
WBC: 10.8 — ABNORMAL HIGH

## 2010-11-25 LAB — URINALYSIS, ROUTINE W REFLEX MICROSCOPIC
Bilirubin Urine: NEGATIVE
Glucose, UA: NEGATIVE mg/dL
Protein, ur: 100 mg/dL — AB
Specific Gravity, Urine: 1.014 (ref 1.005–1.030)

## 2010-11-25 LAB — BASIC METABOLIC PANEL
BUN: 20
BUN: 21
CO2: 25
Calcium: 9.1
Calcium: 9
Creatinine, Ser: 2.39 — ABNORMAL HIGH
GFR calc non Af Amer: 31 — ABNORMAL LOW
GFR calc non Af Amer: 36 — ABNORMAL LOW
GFR calc non Af Amer: 29 — ABNORMAL LOW
Glucose, Bld: 118 — ABNORMAL HIGH
Glucose, Bld: 139 — ABNORMAL HIGH
Glucose, Bld: 108 — ABNORMAL HIGH
Potassium: 4.1
Potassium: 4.3
Sodium: 133 — ABNORMAL LOW
Sodium: 136

## 2010-11-25 LAB — CARDIAC PANEL(CRET KIN+CKTOT+MB+TROPI)
CK, MB: 6.5 — ABNORMAL HIGH
CK, MB: 9.6 — ABNORMAL HIGH
Relative Index: 1.1
Total CK: 640 — ABNORMAL HIGH
Troponin I: 0.06

## 2010-11-25 LAB — URINE MICROSCOPIC-ADD ON

## 2010-11-25 LAB — DIFFERENTIAL
Basophils Absolute: 0 10*3/uL (ref 0.0–0.1)
Basophils Absolute: 0.1
Eosinophils Relative: 4
Lymphocytes Relative: 23 % (ref 12–46)
Lymphocytes Relative: 18
Lymphs Abs: 2
Monocytes Absolute: 1 10*3/uL (ref 0.1–1.0)
Neutro Abs: 7.4 10*3/uL (ref 1.7–7.7)
Neutro Abs: 7.2
Neutrophils Relative %: 63 % (ref 43–77)
Neutrophils Relative %: 67

## 2010-11-25 LAB — D-DIMER, QUANTITATIVE: D-Dimer, Quant: 0.96 — ABNORMAL HIGH

## 2010-11-25 LAB — COMPREHENSIVE METABOLIC PANEL
ALT: 23 U/L (ref 0–53)
Alkaline Phosphatase: 89 U/L (ref 39–117)
BUN: 37 mg/dL — ABNORMAL HIGH (ref 6–23)
CO2: 27 mEq/L (ref 19–32)
Chloride: 89 mEq/L — ABNORMAL LOW (ref 96–112)
GFR calc Af Amer: 4 mL/min — ABNORMAL LOW (ref >90)
GFR calc non Af Amer: 4 mL/min — ABNORMAL LOW (ref >90)
Glucose, Bld: 122 mg/dL — ABNORMAL HIGH (ref 70–99)
Potassium: 4 mEq/L (ref 3.5–5.1)
Sodium: 134 mEq/L — ABNORMAL LOW (ref 135–145)
Total Bilirubin: 0.3 mg/dL (ref 0.3–1.2)
Total Protein: 7.4 g/dL (ref 6.0–8.3)

## 2010-11-25 LAB — LIPID PANEL
Cholesterol: 173
HDL: 48
LDL Cholesterol: 100 — ABNORMAL HIGH
Total CHOL/HDL Ratio: 3.8
Triglycerides: 134
VLDL: 19

## 2010-11-25 LAB — GLUCOSE, CAPILLARY: Glucose-Capillary: 128 mg/dL — ABNORMAL HIGH (ref 70–99)

## 2010-11-25 LAB — POCT CARDIAC MARKERS
Operator id: 1415
Troponin i, poc: <0.05

## 2010-11-25 LAB — CK TOTAL AND CKMB (NOT AT ARMC)
CK, MB: 5.1 — ABNORMAL HIGH
Total CK: 589 — ABNORMAL HIGH

## 2010-11-25 LAB — TROPONIN I: Troponin I: 0.05

## 2010-12-04 ENCOUNTER — Ambulatory Visit (HOSPITAL_COMMUNITY)
Admission: RE | Admit: 2010-12-04 | Discharge: 2010-12-04 | Disposition: A | Discharge status: Home / Self Care | Payer: 59 | Source: Ambulatory Visit | Attending: Internal Medicine | Admitting: Internal Medicine

## 2010-12-04 DIAGNOSIS — M79609 Pain in unspecified limb: Secondary | ICD-10-CM

## 2010-12-04 DIAGNOSIS — I10 Essential (primary) hypertension: Secondary | ICD-10-CM | POA: Insufficient documentation

## 2011-02-13 ENCOUNTER — Other Ambulatory Visit (HOSPITAL_COMMUNITY): Payer: Self-pay | Admitting: Nephrology

## 2011-02-13 DIAGNOSIS — N186 End stage renal disease: Secondary | ICD-10-CM

## 2011-02-25 ENCOUNTER — Encounter (HOSPITAL_COMMUNITY): Payer: Self-pay | Admitting: Pharmacy Technician

## 2011-02-27 ENCOUNTER — Ambulatory Visit (HOSPITAL_COMMUNITY)
Admission: RE | Admit: 2011-02-27 | Discharge: 2011-02-27 | Disposition: A | Discharge status: Home / Self Care | Payer: 59 | Source: Ambulatory Visit | Attending: Nephrology | Admitting: Nephrology

## 2011-02-27 DIAGNOSIS — N186 End stage renal disease: Secondary | ICD-10-CM | POA: Insufficient documentation

## 2011-02-27 DIAGNOSIS — T82898A Other specified complication of vascular prosthetic devices, implants and grafts, initial encounter: Secondary | ICD-10-CM | POA: Insufficient documentation

## 2011-02-27 DIAGNOSIS — Y849 Medical procedure, unspecified as the cause of abnormal reaction of the patient, or of later complication, without mention of misadventure at the time of the procedure: Secondary | ICD-10-CM | POA: Insufficient documentation

## 2011-02-27 MED ORDER — IOHEXOL 300 MG/ML  SOLN
100.0000 mL | Freq: Once | INTRAMUSCULAR | Status: AC | PRN
  Administered 2011-02-27: 45 mL via INTRAVENOUS

## 2011-02-27 NOTE — Procedures (Signed)
Patent left arm fistula.  No stenosis. 

## 2011-03-02 ENCOUNTER — Emergency Department (HOSPITAL_COMMUNITY): Payer: 59

## 2011-03-02 ENCOUNTER — Encounter (HOSPITAL_COMMUNITY): Payer: Self-pay | Admitting: *Deleted

## 2011-03-02 ENCOUNTER — Emergency Department (HOSPITAL_COMMUNITY)
Admission: EM | Admit: 2011-03-02 | Discharge: 2011-03-03 | Disposition: A | Discharge status: Home / Self Care | Payer: 59 | Attending: Emergency Medicine | Admitting: Emergency Medicine

## 2011-03-02 ENCOUNTER — Other Ambulatory Visit: Payer: Self-pay

## 2011-03-02 DIAGNOSIS — E669 Obesity, unspecified: Secondary | ICD-10-CM | POA: Insufficient documentation

## 2011-03-02 DIAGNOSIS — R609 Edema, unspecified: Secondary | ICD-10-CM | POA: Insufficient documentation

## 2011-03-02 DIAGNOSIS — N186 End stage renal disease: Secondary | ICD-10-CM | POA: Insufficient documentation

## 2011-03-02 DIAGNOSIS — Z992 Dependence on renal dialysis: Secondary | ICD-10-CM | POA: Insufficient documentation

## 2011-03-02 DIAGNOSIS — Z8673 Personal history of transient ischemic attack (TIA), and cerebral infarction without residual deficits: Secondary | ICD-10-CM | POA: Insufficient documentation

## 2011-03-02 DIAGNOSIS — R0602 Shortness of breath: Secondary | ICD-10-CM | POA: Insufficient documentation

## 2011-03-02 DIAGNOSIS — E119 Type 2 diabetes mellitus without complications: Secondary | ICD-10-CM | POA: Insufficient documentation

## 2011-03-02 DIAGNOSIS — I2510 Atherosclerotic heart disease of native coronary artery without angina pectoris: Secondary | ICD-10-CM | POA: Insufficient documentation

## 2011-03-02 DIAGNOSIS — Z79899 Other long term (current) drug therapy: Secondary | ICD-10-CM | POA: Insufficient documentation

## 2011-03-02 DIAGNOSIS — R079 Chest pain, unspecified: Secondary | ICD-10-CM | POA: Insufficient documentation

## 2011-03-02 DIAGNOSIS — I120 Hypertensive chronic kidney disease with stage 5 chronic kidney disease or end stage renal disease: Secondary | ICD-10-CM | POA: Insufficient documentation

## 2011-03-02 DIAGNOSIS — J984 Other disorders of lung: Secondary | ICD-10-CM | POA: Insufficient documentation

## 2011-03-02 DIAGNOSIS — R911 Solitary pulmonary nodule: Secondary | ICD-10-CM

## 2011-03-02 LAB — DIFFERENTIAL
Eosinophils Absolute: 0.7 10*3/uL (ref 0.0–0.7)
Monocytes Absolute: 1.3 10*3/uL — ABNORMAL HIGH (ref 0.1–1.0)
Neutrophils Relative %: 68 % (ref 43–77)

## 2011-03-02 LAB — CBC
MCH: 31.7 pg (ref 26.0–34.0)
MCHC: 33.2 g/dL (ref 30.0–36.0)
Platelets: 351 10*3/uL (ref 150–400)
RBC: 3.78 MIL/uL — ABNORMAL LOW (ref 4.22–5.81)

## 2011-03-02 LAB — BASIC METABOLIC PANEL
Calcium: 9.8 mg/dL (ref 8.4–10.5)
GFR calc Af Amer: 7 mL/min — ABNORMAL LOW (ref >90)
GFR calc non Af Amer: 6 mL/min — ABNORMAL LOW (ref >90)
Sodium: 137 mEq/L (ref 135–145)

## 2011-03-02 LAB — PRO B NATRIURETIC PEPTIDE: Pro B Natriuretic peptide (BNP): 1428 pg/mL — ABNORMAL HIGH (ref 0–125)

## 2011-03-02 LAB — POCT I-STAT TROPONIN I: Troponin i, poc: 0.05 ng/mL (ref 0.00–0.08)

## 2011-03-02 NOTE — ED Provider Notes (Signed)
History     [name]: [id number]  Arrival date & time 03/02/11  2025   First MD Initiated Contact with Patient 03/02/11 2134      Chief Complaint  Patient presents with  . Chest Pain    (Consider location/radiation/quality/duration/timing/severity/associated sxs/prior treatment) HPI History provided by pt.   Pt w/ h/o diabetes, uncontrolled HTN and ESRD (dialysis MWF) presents w/ c/o diffuse CP.  Started approx 2 hours after receiving dialysis on Friday and has been intermittent since.  Aggravated by exertion and associated w/ SOB.  Always occurs after receiving dialysis.  He believes that the dialysis nurse is putting something into his machine that is causing his HTN and chest pain.  H/o bipolar disorder.  No known trauma or recent heavy lifting.  Is compliant w/ anti-hypertensives.   Past Medical History  Diagnosis Date  . Diabetes mellitus   . Renal insufficiency   . Hypertension   . Stroke   . Coronary artery disease     History reviewed. No pertinent past surgical history.  History reviewed. No pertinent family history.  History  Substance Use Topics  . Smoking status: Never Smoker   . Smokeless tobacco: Not on file  . Alcohol Use: No      Review of Systems  All other systems reviewed and are negative.    Allergies  Review of patient's allergies indicates no known allergies.  Home Medications   Current Outpatient Rx  Name Route Sig Dispense Refill  . AMLODIPINE BESYLATE 10 MG PO TABS Oral Take 10 mg by mouth daily.    . CEPHALEXIN 500 MG PO CAPS Oral Take 500 mg by mouth every 6 (six) hours.    Marland Kitchen CLONIDINE HCL 0.1 MG PO TABS Oral Take 0.1 mg by mouth 2 (two) times daily.    Marland Kitchen HYDROXYZINE HCL 25 MG PO TABS Oral Take 25 mg by mouth 3 (three) times daily as needed. For itching    . LISINOPRIL 20 MG PO TABS Oral Take 20 mg by mouth daily.      BP 216/112  Pulse 77  Temp(Src) 97.9 F (36.6 C) (Oral)  Resp 18  SpO2 99%  Physical Exam  Nursing note and  vitals reviewed. Constitutional: He is oriented to person, place, and time. He appears well-developed and well-nourished. No distress.       obese  HENT:  Head: Normocephalic and atraumatic.  Eyes:       Normal appearance  Neck: Normal range of motion.  Cardiovascular: Normal rate and regular rhythm.        Hypertension  Pulmonary/Chest: Effort normal and breath sounds normal. No respiratory distress. He has no rales.       Right-sided chest tenderness  Abdominal: Soft. Bowel sounds are normal. He exhibits no distension. There is no tenderness.       No CVA tenderness  Musculoskeletal:       1+ bilateral LE pitting edema.  Strong bruit in fistula in left forearm.    Neurological: He is alert and oriented to person, place, and time.  Skin: Skin is warm and dry. No rash noted.  Psychiatric: He has a normal mood and affect. His behavior is normal.    ED Course  Procedures (including critical care time)  Date: 03/03/2011  Rate: 75  Rhythm: normal sinus rhythm  QRS Axis: right  Intervals: PR prolonged  ST/T Wave abnormalities: normal  Conduction Disutrbances:first-degree A-V block   Narrative Interpretation:   Old EKG Reviewed: unchanged   Labs  Reviewed  CBC - Abnormal; Notable for the following:    WBC 14.6 (*)    RBC 3.78 (*)    Hemoglobin 12.0 (*)    HCT 36.1 (*)    All other components within normal limits  DIFFERENTIAL - Abnormal; Notable for the following:    Neutro Abs 10.0 (*)    Monocytes Absolute 1.3 (*)    All other components within normal limits  BASIC METABOLIC PANEL - Abnormal; Notable for the following:    Glucose, Bld 126 (*)    BUN 24 (*)    Creatinine, Ser 9.17 (*)    GFR calc non Af Amer 6 (*)    GFR calc Af Amer 7 (*)    All other components within normal limits  PRO B NATRIURETIC PEPTIDE - Abnormal; Notable for the following:    Pro B Natriuretic peptide (BNP) 1428.0 (*)    All other components within normal limits  POCT I-STAT TROPONIN I    I-STAT TROPONIN I   Dg Chest 2 View  03/02/2011  *RADIOLOGY REPORT*  Clinical Data: Shortness of breath, chest pain, dialysis patient  CHEST - 2 VIEW  Comparison: 11/25/2010  Findings: Borderline cardiac enlargement and vascular congestion. No definite CHF, focal pneumonia, effusion or pneumothorax.  Right lower chest nodular density noted measuring 19 mm.  This could represent superimposed shadows versus underlying lung nodule. Recommend follow-up non emergent chest CT.  Trachea midline.  IMPRESSION: Negative for acute CHF or pneumonia.  Possible left lower lobe nodule warrants non emergent workup.  Original Report Authenticated By: Judie Petit. Ruel Favors, M.D.     1. Chest pain   2. Pulmonary nodule       MDM  Dialysis pt presents w/ c/o CP and SOB since dialysis on Friday.  Always occurs following dialysis and he believes his nurse is poisoning him.  Exam sig for clear lungs, mild right-sided chest tenderness and 1+ pitting LE edema.  EKG non-ischemia and unchanged, CXR neg and labs sig for elevated BNP.  Pt is not clinically fluid overloaded.  Dr. Ignacia Palma has evaluated and discussed his test results with him and recommends discharge w/ close PCP f/u.  Pt d/c'd home.     Medical screening examination/treatment/procedure(s) were conducted as a shared visit with non-physician practitioner(s) and myself.  I personally evaluated the patient during the encounter Pt with paranoid ideation and history of bipolar presents with chest pain that comes on after dialysis.  Exam shows no evidence of CHF; EKG and cardiac markers negative. Osvaldo Human, M.D.      Arie Sabina [place], [place] 03/03/11 0135  Carleene Cooper III, MD 03/04/11 [phone]

## 2011-03-02 NOTE — ED Notes (Signed)
Patient transported to X-ray on stretcher with transporter. 

## 2011-03-02 NOTE — ED Notes (Signed)
Patient transported from X-ray.  Pt ambulated to and from RR with steady gait.

## 2011-03-02 NOTE — ED Notes (Signed)
The pt is c/o lt sided chest pain since Friday in dialysis and he also has some pain in both legs.  He has had these symptoms for 2-3 weeks.  Dialysis shunt rt upper arm

## 2011-03-03 MED ORDER — ASPIRIN 81 MG PO CHEW
324.0000 mg | CHEWABLE_TABLET | Freq: Once | ORAL | Status: AC
  Administered 2011-03-03: 324 mg via ORAL
  Filled 2011-03-03: qty 1

## 2011-03-03 NOTE — ED Provider Notes (Signed)
12:34 AM  Date: 03/03/2011  Rate: 75  Rhythm: normal sinus rhythm  QRS Axis: right  Intervals: PR prolonged  ST/T Wave abnormalities: normal  Conduction Disutrbances:first-degree A-V block   Narrative Interpretation: Abnormal EKG  Old EKG Reviewed: none available    Carleene Cooper III, MD 03/03/11 [phone]

## 2011-03-12 ENCOUNTER — Telehealth: Payer: Self-pay | Admitting: Pulmonary Disease

## 2011-03-12 NOTE — Telephone Encounter (Signed)
I spoke with pt and he states someone from our office called him and said he could be seen tomorrow morning. I do not see this and did not see any message in epic. i advised pt did not have any sooner apts. Pt was fine with this and needed nothing further

## 2011-03-12 NOTE — Telephone Encounter (Signed)
Pt is now stating that Victorino Dike called him & left a message for an appointment tomorrow.  Antionette Fairy

## 2011-03-18 ENCOUNTER — Other Ambulatory Visit: Payer: Self-pay | Admitting: Nephrology

## 2011-03-18 ENCOUNTER — Ambulatory Visit
Admission: RE | Admit: 2011-03-18 | Discharge: 2011-03-18 | Disposition: A | Discharge status: Home / Self Care | Payer: 59 | Source: Ambulatory Visit | Attending: Nephrology | Admitting: Nephrology

## 2011-03-18 DIAGNOSIS — R911 Solitary pulmonary nodule: Secondary | ICD-10-CM

## 2011-03-20 ENCOUNTER — Institutional Professional Consult (permissible substitution): Payer: 59 | Admitting: Cardiovascular Disease

## 2011-03-25 ENCOUNTER — Encounter: Payer: Self-pay | Admitting: Pulmonary Disease

## 2011-03-25 ENCOUNTER — Ambulatory Visit (INDEPENDENT_AMBULATORY_CARE_PROVIDER_SITE_OTHER): Payer: 59 | Admitting: Pulmonary Disease

## 2011-03-25 VITALS — BP 162/82 | HR 80 | Temp 97.9°F | Ht 70.0 in | Wt 320.8 lb

## 2011-03-25 DIAGNOSIS — R911 Solitary pulmonary nodule: Secondary | ICD-10-CM

## 2011-03-25 NOTE — Progress Notes (Signed)
  Subjective:    Patient ID: Gabriel Bell, male    DOB: [birth year], 51 y.o.   MRN: [id number]  HPI The patient is a 51 year old male who I've been asked to see for a possible pulmonary nodule.  He recently had a chest x-ray, and this showed a questionable nodular density in the left lower lobe.  He had a followup chest x-ray which showed a more linear density.  The patient has never smoked, and has no personal history of cancer.  He has never been exposed to tuberculosis, and has had a negative PPD in the past.  He has lived in [place] in the past, but never in the [place].  He denies any hemoptysis or chest congestion.  The patient has been working on losing weight, and states that his appetite is excellent.   Review of Systems  Constitutional: Negative for fever and unexpected weight change.  HENT: Positive for ear pain. Negative for nosebleeds, congestion, sore throat, rhinorrhea, sneezing, trouble swallowing, dental problem, postnasal drip and sinus pressure.   Eyes: Negative for redness and itching.  Respiratory: Positive for shortness of breath. Negative for cough, chest tightness and wheezing.   Cardiovascular: Positive for chest pain. Negative for palpitations and leg swelling.  Gastrointestinal: Positive for abdominal pain. Negative for nausea and vomiting.  Genitourinary: Negative for dysuria.  Musculoskeletal: Negative for joint swelling.  Skin: Negative for rash.  Neurological: Negative for headaches.  Hematological: Does not bruise/bleed easily.  Psychiatric/Behavioral: Negative for dysphoric mood. The patient is not nervous/anxious.        Objective:   Physical Exam Constitutional:  Obese male, no acute distress  HENT:  Nares patent without discharge  Oropharynx without exudate, palate and uvula are thick and elongated.   Eyes:  Perrla, eomi, no scleral icterus  Neck:  No JVD, no TMG  Cardiovascular:  Normal rate, regular rhythm, no rubs or gallops.  3/6 blowing  murmur        Intact distal pulses, but decreased.  Pulmonary :  Normal breath sounds, no stridor or respiratory distress   No rales, rhonchi, or wheezing  Abdominal:  Soft, nondistended, bowel sounds present.  No tenderness noted.   Musculoskeletal:  Minimal lower extremity edema noted.  Lymph Nodes:  No cervical lymphadenopathy noted  Skin:  No cyanosis noted  Neurologic:  Alert, appropriate, moves all 4 extremities without obvious deficit.         Assessment & Plan:

## 2011-03-25 NOTE — Assessment & Plan Note (Signed)
The patient has a questionable nodular density in the left lower lobe, but this may simply be a scar.  He has no history to support a diagnosis of old granulomatous disease, but is also low risk with no personal history of cancer or tobacco abuse.  At this point, I think we need to do a CT of the chest to put the issue to rest once and for all.  If this is unremarkable, and no further followup is required.

## 2011-03-25 NOTE — Patient Instructions (Signed)
Will check scan of chest to evaluate possible "spot" in bottom of left lung.  Will also look at right lung. Will call you with the results of the scan.

## 2011-03-27 ENCOUNTER — Ambulatory Visit (INDEPENDENT_AMBULATORY_CARE_PROVIDER_SITE_OTHER)
Admission: RE | Admit: 2011-03-27 | Discharge: 2011-03-27 | Disposition: A | Discharge status: Home / Self Care | Payer: 59 | Source: Ambulatory Visit | Attending: Pulmonary Disease | Admitting: Pulmonary Disease

## 2011-03-27 DIAGNOSIS — R911 Solitary pulmonary nodule: Secondary | ICD-10-CM

## 2011-04-09 ENCOUNTER — Other Ambulatory Visit: Payer: Self-pay

## 2011-04-09 ENCOUNTER — Emergency Department (HOSPITAL_COMMUNITY): Payer: 59

## 2011-04-09 ENCOUNTER — Encounter (HOSPITAL_COMMUNITY): Payer: Self-pay | Admitting: Emergency Medicine

## 2011-04-09 ENCOUNTER — Inpatient Hospital Stay (HOSPITAL_COMMUNITY)
Admission: EM | Admit: 2011-04-09 | Discharge: 2011-04-12 | DRG: 193 | Disposition: A | Discharge status: Home / Self Care | Payer: 59 | Attending: Internal Medicine | Admitting: Internal Medicine

## 2011-04-09 DIAGNOSIS — E669 Obesity, unspecified: Secondary | ICD-10-CM | POA: Diagnosis present

## 2011-04-09 DIAGNOSIS — R9389 Abnormal findings on diagnostic imaging of other specified body structures: Secondary | ICD-10-CM | POA: Diagnosis present

## 2011-04-09 DIAGNOSIS — I120 Hypertensive chronic kidney disease with stage 5 chronic kidney disease or end stage renal disease: Secondary | ICD-10-CM | POA: Diagnosis present

## 2011-04-09 DIAGNOSIS — Z992 Dependence on renal dialysis: Secondary | ICD-10-CM

## 2011-04-09 DIAGNOSIS — I10 Essential (primary) hypertension: Secondary | ICD-10-CM

## 2011-04-09 DIAGNOSIS — R0789 Other chest pain: Secondary | ICD-10-CM | POA: Diagnosis present

## 2011-04-09 DIAGNOSIS — N186 End stage renal disease: Secondary | ICD-10-CM

## 2011-04-09 DIAGNOSIS — R079 Chest pain, unspecified: Secondary | ICD-10-CM | POA: Diagnosis present

## 2011-04-09 DIAGNOSIS — Z8249 Family history of ischemic heart disease and other diseases of the circulatory system: Secondary | ICD-10-CM

## 2011-04-09 DIAGNOSIS — J189 Pneumonia, unspecified organism: Secondary | ICD-10-CM

## 2011-04-09 DIAGNOSIS — Z8673 Personal history of transient ischemic attack (TIA), and cerebral infarction without residual deficits: Secondary | ICD-10-CM

## 2011-04-09 DIAGNOSIS — R911 Solitary pulmonary nodule: Secondary | ICD-10-CM

## 2011-04-09 DIAGNOSIS — E119 Type 2 diabetes mellitus without complications: Secondary | ICD-10-CM | POA: Diagnosis present

## 2011-04-09 DIAGNOSIS — I2510 Atherosclerotic heart disease of native coronary artery without angina pectoris: Secondary | ICD-10-CM | POA: Diagnosis present

## 2011-04-09 HISTORY — DX: Dependence on renal dialysis: Z99.2

## 2011-04-09 HISTORY — DX: Dependence on renal dialysis: N18.6

## 2011-04-09 LAB — POCT I-STAT TROPONIN I: Troponin i, poc: 0.04 ng/mL (ref 0.00–0.08)

## 2011-04-09 LAB — CBC
MCH: 31 pg (ref 26.0–34.0)
MCHC: 33.2 g/dL (ref 30.0–36.0)
Platelets: 383 10*3/uL (ref 150–400)

## 2011-04-09 LAB — BASIC METABOLIC PANEL
Calcium: 10 mg/dL (ref 8.4–10.5)
GFR calc non Af Amer: 11 mL/min — ABNORMAL LOW (ref >90)
Glucose, Bld: 135 mg/dL — ABNORMAL HIGH (ref 70–99)
Sodium: 135 mEq/L (ref 135–145)

## 2011-04-09 MED ORDER — LEVOFLOXACIN IN D5W 750 MG/150ML IV SOLN
750.0000 mg | INTRAVENOUS | Status: DC
  Administered 2011-04-10: 750 mg via INTRAVENOUS
  Filled 2011-04-09: qty 150

## 2011-04-09 NOTE — ED Provider Notes (Signed)
History     [name]: [id number]  Arrival date & time 04/09/11  1700   First MD Initiated Contact with Patient 04/09/11 2122      Chief Complaint  Patient presents with  . Chest Pain  . Shortness of Breath    (Consider location/radiation/quality/duration/timing/severity/associated sxs/prior treatment) HPI History provided by the patient. History limited secondary to poor historian.  51 year old male with a history of end-stage renal disease on hemodialysis, diabetes, and CAD presents complaining of chest pain.  Patient reports that he completed dialysis around 11:30 AM and began having chest pain about 2 hours later. Patient described his pain as left-sided, sharp, mildly pleuritic and worse with movement, occasionally radiating to his left upper extremity near his fistula, moderate to severe, intermittent, and similar to prior. Patient denies associated fever or chills but he has had sweating increase from baseline over the last 2 days.  Pt takes ASA daily and reports taking about 6 of these today 2/2 pain without sig improvement.  Mild associated SOB and days of possible increased cough from baseline.   Past Medical History  Diagnosis Date  . Diabetes mellitus   . Renal insufficiency   . Hypertension   . Stroke   . Coronary artery disease     History reviewed. No pertinent past surgical history.  Family History  Problem Relation Age of Onset  . Heart disease Mother   . Heart disease Father     History  Substance Use Topics  . Smoking status: Never Smoker   . Smokeless tobacco: Not on file  . Alcohol Use: No      Review of Systems  Constitutional: Positive for diaphoresis. Negative for fever, chills and appetite change.  HENT: Negative for congestion, sore throat and rhinorrhea.   Eyes: Negative for pain and visual disturbance.  Respiratory: Positive for cough and shortness of breath.   Cardiovascular: Positive for chest pain. Negative for palpitations.    Gastrointestinal: Negative for nausea, vomiting, abdominal pain and diarrhea.  Genitourinary: Negative for dysuria and hematuria.  Musculoskeletal: Negative for back pain and gait problem.  Skin: Negative for rash and wound.  Neurological: Negative for dizziness and headaches.  Psychiatric/Behavioral: Negative for confusion and agitation.  All other systems reviewed and are negative.    Allergies  Review of patient's allergies indicates no known allergies.  Home Medications   Current Outpatient Rx  Name Route Sig Dispense Refill  . AMLODIPINE BESYLATE 10 MG PO TABS Oral Take 10 mg by mouth daily.    . ASPIRIN 81 MG PO CHEW Oral Chew 162 mg by mouth daily as needed. For pain    . CLONIDINE HCL 0.1 MG PO TABS Oral Take 0.1 mg by mouth 2 (two) times daily.    Marland Kitchen HYDROXYZINE HCL 25 MG PO TABS Oral Take 25 mg by mouth 3 (three) times daily as needed. For itching    . LISINOPRIL 20 MG PO TABS Oral Take 20 mg by mouth daily.      BP 147/76  Pulse 75  Temp(Src) 98 F (36.7 C) (Oral)  Resp 22  SpO2 99%  Physical Exam  Nursing note and vitals reviewed. Constitutional: He is oriented to person, place, and time.       Obese, alert, no acute distress  HENT:  Head: Normocephalic and atraumatic.  Right Ear: External ear normal.  Left Ear: External ear normal.  Nose: Nose normal.  Mouth/Throat: Oropharynx is clear and moist.  Eyes: Conjunctivae and EOM are normal. Pupils are  equal, round, and reactive to light.  Neck: Normal range of motion. Neck supple.  Cardiovascular: Normal rate, regular rhythm and intact distal pulses.   Murmur heard. Pulmonary/Chest: Effort normal. No respiratory distress. He has no wheezes. He has no rales. He exhibits tenderness (significanlty tender in central to Lt chest wall).  Abdominal: Soft. Bowel sounds are normal. He exhibits no distension. There is no tenderness.       Obese  Musculoskeletal: Normal range of motion. He exhibits edema (trace LE edema  bilaterally) and tenderness (diffuse back tenderness for percussion).       LUE fistula in Lt distal forearm with good thrill.  Neurological: He is alert and oriented to person, place, and time.  Skin: Skin is warm and dry. No rash noted. He is not diaphoretic.  Psychiatric: He has a normal mood and affect. Judgment normal.    ED Course  Procedures (including critical care time)  Labs Reviewed  CBC - Abnormal; Notable for the following:    WBC 11.8 (*)    RBC 4.10 (*)    Hemoglobin 12.7 (*)    HCT 38.2 (*)    All other components within normal limits  BASIC METABOLIC PANEL - Abnormal; Notable for the following:    Chloride 95 (*)    Glucose, Bld 135 (*)    Creatinine, Ser 5.55 (*)    GFR calc non Af Amer 11 (*)    GFR calc Af Amer 12 (*)    All other components within normal limits  POCT I-STAT TROPONIN I   Dg Chest 2 View  04/09/2011  *RADIOLOGY REPORT*  Clinical Data: Short of breath.  Hypertension.  CHEST - 2 VIEW  Comparison: 03/18/2011  Findings: Cardiomegaly persist with left ventricular prominence. The aorta is unfolded.  There is development of new patchy densities in both lower lobes.  These could be small areas of infiltrate or volume loss.  No effusions.  No bony abnormalities.  IMPRESSION: Chronic cardiomegaly.  Development of small areas of patchy density both lower lobes could reflect mild pneumonia.  Original Report Authenticated By: Thomasenia Sales, M.D.     1. Chest pain   2. Pneumonia       MDM  51 year old male with history of end-stage renal disease on hemodialysis present with complaint of left-sided sharp chest pain about 2 hours after dialysis today. Patient did complete his full HD session.  Mild associated sweating, shortness of breath, and cough.    Exam as above, AF, hypertensive, left chest wall tender to palpation.  EKG reportedly without acute ischemia.  CXR with apparent new opacities c/f PNA.  Somewhat atypical presentation; however, plan to Tx  with Levofloxacin and admit the pt for cycling of trop for ACS r/o.  [facility] to admit.       Particia Lather, MD 04/10/11 [phone]

## 2011-04-09 NOTE — ED Notes (Signed)
Pt c/o generalized CP and SOB starting today during dialysis; pt sts worse with palpation and inspiration; pt sts had full dialysis treatment today; pt sts some dizziness also

## 2011-04-09 NOTE — ED Notes (Signed)
Paged [facility] TO  [phone]

## 2011-04-09 NOTE — ED Notes (Signed)
Meal tray provided.

## 2011-04-09 NOTE — ED Notes (Signed)
MD at bedside. 

## 2011-04-10 ENCOUNTER — Encounter (HOSPITAL_COMMUNITY): Payer: Self-pay | Admitting: Internal Medicine

## 2011-04-10 DIAGNOSIS — R079 Chest pain, unspecified: Secondary | ICD-10-CM | POA: Diagnosis present

## 2011-04-10 DIAGNOSIS — N186 End stage renal disease: Secondary | ICD-10-CM | POA: Diagnosis present

## 2011-04-10 DIAGNOSIS — R9389 Abnormal findings on diagnostic imaging of other specified body structures: Secondary | ICD-10-CM | POA: Diagnosis present

## 2011-04-10 DIAGNOSIS — I10 Essential (primary) hypertension: Secondary | ICD-10-CM | POA: Diagnosis present

## 2011-04-10 LAB — CARDIAC PANEL(CRET KIN+CKTOT+MB+TROPI)
Relative Index: 1.2 (ref 0.0–2.5)
Relative Index: 1.4 (ref 0.0–2.5)
Total CK: 269 U/L — ABNORMAL HIGH (ref 7–232)
Total CK: 242 U/L — ABNORMAL HIGH (ref 7–232)
Total CK: 221 U/L (ref 7–232)

## 2011-04-10 LAB — BASIC METABOLIC PANEL
BUN: 18 mg/dL (ref 6–23)
Creatinine, Ser: 6.26 mg/dL — ABNORMAL HIGH (ref 0.50–1.35)
GFR calc Af Amer: 11 mL/min — ABNORMAL LOW (ref >90)
GFR calc non Af Amer: 9 mL/min — ABNORMAL LOW (ref >90)
Potassium: 4.1 mEq/L (ref 3.5–5.1)

## 2011-04-10 LAB — CBC
HCT: 37.1 % — ABNORMAL LOW (ref 39.0–52.0)
MCHC: 33.2 g/dL (ref 30.0–36.0)
MCV: 93.7 fL (ref 78.0–100.0)
RDW: 14.1 % (ref 11.5–15.5)

## 2011-04-10 MED ORDER — CLONIDINE HCL 0.1 MG PO TABS
0.1000 mg | ORAL_TABLET | Freq: Two times a day (BID) | ORAL | Status: DC
  Administered 2011-04-10 (×2): 0.1 mg via ORAL
  Filled 2011-04-10 (×5): qty 1

## 2011-04-10 MED ORDER — ONDANSETRON HCL 4 MG PO TABS: 4.0000 mg | ORAL_TABLET | Freq: Four times a day (QID) | ORAL | Status: DC | PRN

## 2011-04-10 MED ORDER — ONDANSETRON HCL 4 MG/2ML IJ SOLN: 4.0000 mg | Freq: Four times a day (QID) | INTRAMUSCULAR | Status: DC | PRN

## 2011-04-10 MED ORDER — SENNA 8.6 MG PO TABS
1.0000 | ORAL_TABLET | Freq: Two times a day (BID) | ORAL | Status: DC
  Administered 2011-04-10 – 2011-04-12 (×5): 8.6 mg via ORAL
  Filled 2011-04-10 (×6): qty 1

## 2011-04-10 MED ORDER — ACETAMINOPHEN 650 MG RE SUPP: 650.0000 mg | Freq: Four times a day (QID) | RECTAL | Status: DC | PRN

## 2011-04-10 MED ORDER — AMLODIPINE BESYLATE 10 MG PO TABS
10.0000 mg | ORAL_TABLET | Freq: Every day | ORAL | Status: DC
  Administered 2011-04-10 – 2011-04-12 (×3): 10 mg via ORAL
  Filled 2011-04-10 (×3): qty 1

## 2011-04-10 MED ORDER — SODIUM CHLORIDE 0.9 % IJ SOLN
3.0000 mL | Freq: Two times a day (BID) | INTRAMUSCULAR | Status: DC
  Administered 2011-04-10 (×2): 3 mL via INTRAVENOUS

## 2011-04-10 MED ORDER — SODIUM CHLORIDE 0.9 % IJ SOLN
3.0000 mL | Freq: Two times a day (BID) | INTRAMUSCULAR | Status: DC
  Administered 2011-04-10 – 2011-04-12 (×5): 3 mL via INTRAVENOUS

## 2011-04-10 MED ORDER — NITROGLYCERIN 0.4 MG SL SUBL: 0.4000 mg | SUBLINGUAL_TABLET | SUBLINGUAL | Status: DC | PRN

## 2011-04-10 MED ORDER — DOCUSATE SODIUM 100 MG PO CAPS
100.0000 mg | ORAL_CAPSULE | Freq: Two times a day (BID) | ORAL | Status: DC
  Administered 2011-04-10 – 2011-04-12 (×4): 100 mg via ORAL
  Filled 2011-04-10 (×6): qty 1

## 2011-04-10 MED ORDER — ASPIRIN EC 325 MG PO TBEC
325.0000 mg | DELAYED_RELEASE_TABLET | Freq: Every day | ORAL | Status: DC
Start: 2011-04-10 — End: 2011-04-12
  Administered 2011-04-10 – 2011-04-12 (×3): 325 mg via ORAL
  Filled 2011-04-10 (×4): qty 1

## 2011-04-10 MED ORDER — HEPARIN SODIUM (PORCINE) 5000 UNIT/ML IJ SOLN
5000.0000 [IU] | Freq: Three times a day (TID) | INTRAMUSCULAR | Status: DC
  Administered 2011-04-10 (×2): 5000 [IU] via SUBCUTANEOUS
  Filled 2011-04-10 (×10): qty 1

## 2011-04-10 MED ORDER — SODIUM CHLORIDE 0.9 % IV SOLN: 250.0000 mL | INTRAVENOUS | Status: DC | PRN

## 2011-04-10 MED ORDER — SODIUM CHLORIDE 0.9 % IJ SOLN: 3.0000 mL | INTRAMUSCULAR | Status: DC | PRN

## 2011-04-10 MED ORDER — LISINOPRIL 20 MG PO TABS
20.0000 mg | ORAL_TABLET | Freq: Every day | ORAL | Status: DC
  Administered 2011-04-10 – 2011-04-12 (×3): 20 mg via ORAL
  Filled 2011-04-10 (×4): qty 1

## 2011-04-10 MED ORDER — CEPHALEXIN 500 MG PO CAPS
500.0000 mg | ORAL_CAPSULE | Freq: Four times a day (QID) | ORAL | Status: DC
  Administered 2011-04-10: 500 mg via ORAL
  Filled 2011-04-10 (×4): qty 1

## 2011-04-10 MED ORDER — LEVOFLOXACIN IN D5W 500 MG/100ML IV SOLN
500.0000 mg | INTRAVENOUS | Status: DC
  Administered 2011-04-12: 500 mg via INTRAVENOUS
  Filled 2011-04-10 (×3): qty 100

## 2011-04-10 MED ORDER — ACETAMINOPHEN 325 MG PO TABS
650.0000 mg | ORAL_TABLET | Freq: Four times a day (QID) | ORAL | Status: DC | PRN
  Administered 2011-04-10 (×2): 650 mg via ORAL
  Filled 2011-04-10 (×2): qty 2

## 2011-04-10 NOTE — H&P (Signed)
PCP:  Dorrene German, MD, MD  Pt doesn't endorse that this is his PCP He also states Docia Furl is his PCP, and also that [facility]  Chief Complaint:  Chest pain  HPI: 50yoM with h/o ESRD on HD since 04/2010 (MWF), HTN, reported h/o strokes presents with  chest pain while at HD.   Pt was seen in the ED 1/13 with diffuse chest pain that started 2 hrs after dialysis  and was intermittent since, worse with exertion and assoc'd with SOB. He had stated it  always occurred after dialysis, and "he believes that the HD nurse is poisoning him."  ECG was non-ischemic and CXR showed possible LLL nodule. He was then sent to see Dr.  Shelle Iron on 2/5 for this possible pulmonary nodule, and f/u CXR showed more linear  density. He was sent for chest CT which basically showed nothing pulmonary, but some  coronary calcifications.   Pt is a really difficult historian, I suspect due to cognitive or psychiatric deficits,  he is pressured in his speech, tangential, doesn't answer questions. But from what I  can gather, he has had chest pain for the past 5 months, intermittent, possibly worse  with dialysis with some finger tingling, but also worse when his chest is palpated.  Unclear if other associated symptoms, possibly some dizziness. Unclear if he gets  exertional angina. He denies any h/o MI's.   In the ED vitals were stable except HTN up to 152/103. Labs with renal 15/5.55. WBC  11.8, pt noted to have WBC 14.6 back in 02/2011. hct 38.2 stable. Trop POC 0.04. CXR  with chronic cardiomegaly, development of small areas of patchy density in both lower  lobes, could reflect mild PNA.   When asked about PNA symptoms he does state he's been coughing up thick pus and has  been having sweats as well, but again he also endorses many other complaints.   Past Medical History  Diagnosis Date  . Diabetes mellitus   . ESRD on dialysis   . Hypertension   . Stroke     States he had 3  strokes at once  . Coronary artery disease     Coronary calcification seen on chest CT. Pt denies MI's     History reviewed. No pertinent past surgical history.  Medications:  HOME MEDS:  Reconciled by name with patient  Prior to Admission medications   Medication Sig Start Date End Date Taking? Authorizing Provider  amLODipine (NORVASC) 10 MG tablet Take 10 mg by mouth daily.   Yes Historical Provider, MD  aspirin 81 MG chewable tablet Chew 162 mg by mouth daily as needed. For pain   Yes Historical Provider, MD  cloNIDine (CATAPRES) 0.1 MG tablet Take 0.1 mg by mouth 2 (two) times daily.   Yes Historical Provider, MD  hydrOXYzine (ATARAX/VISTARIL) 25 MG tablet Take 25 mg by mouth 3 (three) times daily as needed. For itching   Yes Historical Provider, MD  lisinopril (PRINIVIL,ZESTRIL) 20 MG tablet Take 20 mg by mouth daily.   Yes Historical Provider, MD   Allergies:  No Known Allergies  Social History:   reports that he has never smoked. He does not have any smokeless tobacco history on file. He reports that he does not drink alcohol. His drug history not on file. 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   Family History: Family History  Problem  Relation Age of Onset  . Heart disease Mother   . Heart disease Father     Physical Exam: Filed Vitals:   04/09/11 1703 04/09/11 1757 04/09/11 1842  BP: 152/103  147/76  Pulse: 79 76 75  Temp: 98.1 F (36.7 C)  98 F (36.7 C)  TempSrc: Oral  Oral  Resp: 16  22  SpO2: 97% 99% 99%   Blood pressure 147/76, pulse 75, temperature 98 F (36.7 C), temperature source Oral, resp. rate 22, SpO2 99.00%.  Gen: Obese M in ED stretcher, no distress but odd affect, pressured speech, difficult  to understand, very tangential thoughts.  HEENT: Pupils round, EOMI, sclera clear, normal appearing. Mouth moist, normal  appearing.  Lungs: CTAB no w/c/r, normal exam, good air  movement Heart: Regular, not tachycardic, but with prominent systolic AS type murmur through  precordium. He jumps up and endorses pain when you palpate his chest  Abd: Obese, but soft, nontender, nondistended.  Extrem: Woody, brawny changes with hard induration of BLE's but no frank soft pitting  edema. LUE with fistula wrapped over, did not take down.  Neuro: Alert, attentive CN 2-12 intact, moves extremities spontaneously, grossly non- focal   Labs & Imaging Results for orders placed during the hospital encounter of 04/09/11 (from the past 48 hour(s))  CBC     Status: Abnormal   Collection Time   04/09/11  7:08 PM      Component Value Range Comment   WBC 11.8 (*) 4.0 - 10.5 (K/uL)    RBC 4.10 (*) 4.22 - 5.81 (MIL/uL)    Hemoglobin 12.7 (*) 13.0 - 17.0 (g/dL)    HCT 16.1 (*) 09.6 - 52.0 (%)    MCV 93.2  78.0 - 100.0 (fL)    MCH 31.0  26.0 - 34.0 (pg)    MCHC 33.2  30.0 - 36.0 (g/dL)    RDW 04.5  40.9 - 81.1 (%)    Platelets 383  150 - 400 (K/uL)   BASIC METABOLIC PANEL     Status: Abnormal   Collection Time   04/09/11  7:08 PM      Component Value Range Comment   Sodium 135  135 - 145 (mEq/L)    Potassium 4.0  3.5 - 5.1 (mEq/L)    Chloride 95 (*) 96 - 112 (mEq/L)    CO2 29  19 - 32 (mEq/L)    Glucose, Bld 135 (*) 70 - 99 (mg/dL)    BUN 14  6 - 23 (mg/dL)    Creatinine, Ser 9.14 (*) 0.50 - 1.35 (mg/dL)    Calcium 78.2  8.4 - 10.5 (mg/dL)    GFR calc non Af Amer 11 (*) >90 (mL/min)    GFR calc Af Amer 12 (*) >90 (mL/min)   POCT I-STAT TROPONIN I     Status: Normal   Collection Time   04/09/11  7:30 PM      Component Value Range Comment   Troponin i, poc 0.04  0.00 - 0.08 (ng/mL)    Comment 3             Dg Chest 2 View  04/09/2011  *RADIOLOGY REPORT*  Clinical Data: Short of breath.  Hypertension.  CHEST - 2 VIEW  Comparison: 03/18/2011  Findings: Cardiomegaly persist with left ventricular prominence. The aorta is unfolded.  There is development of new patchy densities in  both lower lobes.  These could be small areas of infiltrate or volume loss.  No effusions.  No bony  abnormalities.  IMPRESSION: Chronic cardiomegaly.  Development of small areas of patchy density both lower lobes could reflect mild pneumonia.  Original Report Authenticated By: Thomasenia Sales, M.D.   ECG: NSR with RAD, PR prolongation, LAE, no Q waves, narrow QRS, S1Q3T3 pattern  (LPFB?), no ST deviations, TWI in V5-6. Not significantly changed compared to prior,  although lateral TWI seem more prominent.    Echo 2008 SUMMARY  -  There is a mild sparkling appearance to the left ventricle that        can be seen in patients with amyloidosis. Clinical        correlation is recommended. Overall left ventricular systolic        function was normal. Left ventricular ejection fraction was        estimated to be 70 %. There were no left ventricular regional        wall motion abnormalities. Left ventricular wall thickness        was markedly increased.  -  The left atrium was moderately dilated.  Impression Present on Admission:  .Chest pain .Abnormal chest x-ray .ESRD (end stage renal disease) .Hypertension  50yoM with h/o ESRD on HD since 04/2010 (MWF), HTN, reported h/o strokes presents with  chest pain while at HD.   1. Chest pain: Hard to say etiology. History is unreliable, I think likely due to  baseline cognitive or psychiatric reasons. Nevertheless, he has significant risk  factors and coronary calcifications and CAD seen on chest CT so coronary angina is  quite reasonable. Also in the DDx would be pericarditis, chostcondritis/MSK, valvular  ischemia given loud murmur. 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   Finally given known calcifications on CT chest, would get her set up with stress test  before discharge.   - Cardiology consultation in the am. Trending enzymes and ECG and repeat echo.  - Increase ASA to 325 for now. Give SL NTG's,  continue lisinopril, amlodipine,  clonidine.   2. CXR abnormalities: Has had several CXR with linear or nodular streaks, followed by  CT chest this month ordered by Dr. Shelle Iron which was completely normal. Now, CXR shows  bibasilar opacities called as PNA. Although I'm not overwhelmed for true PNA, given  pt's low grade WBC count and possible subjective fevers and productive cough (although  again difficult to rely on history), will treat this as CAP.  - Levofloxacin, sputum culture   3. ESRD on HD: on MWF, will call renal   Presumed full code   Other plans as per orders.  Gabriel Bell 04/10/2011, 12:40 AM

## 2011-04-10 NOTE — ED Notes (Signed)
Pt to be transferred to 4700, pt report gave to [facility]

## 2011-04-10 NOTE — ED Provider Notes (Signed)
I saw and evaluated the patient, reviewed the resident's note and I agree with the findings and plan.  Loren Racer, MD 04/10/11 [phone]

## 2011-04-10 NOTE — Progress Notes (Signed)
Utilization review complete 

## 2011-04-10 NOTE — Progress Notes (Addendum)
Patient ID: Gabriel Bell, male   DOB: 04-Aug-1960, 50 y.o.   MRN: [id number]  Assessment/Plan:  Principal Problem:   *Chest pain - likely musculoskeletal in origin - cardiac enzymes are negative so far - follow up 2 D ECHO - obtain pro BNP  Active Problems:  Community acquired pneumonia - continue Levaquin and D/C cephalexin - blood cultures to date are negative and patient remains afebrile   ESRD (end stage renal disease) - renal consult for HD MWF   Hypertension - Continue norvasc 10 mg daily, clonidine and lisinopril   EDUCATION - test results and diagnostic studies were discussed with patientat the bedside - patient has verbalized the understanding - questions were answered at the bedside and contact information was provided for additional questions or concerns   Subjective: No events overnight. Patient denies chest pain, shortness of breath, abdominal pain. Had bowel movement and reports ambulating.  Objective:  Vital signs in last 24 hours:  Filed Vitals:   04/17/2011 1842 04/10/11 0158 04/10/11 0602 04/10/11 1346  BP: 147/76 150/80 175/86 173/87  Pulse: 75 80 81 78  Temp: 98 F (36.7 C) 98.5 F (36.9 C) 98.2 F (36.8 C) 98.3 F (36.8 C)  TempSrc: Oral Oral Oral Oral  Resp: 22 18 22 20   Height:  5\' 10"  (1.778 m)    Weight:  144.38 kg (318 lb 4.8 oz)    SpO2: 99% 98% 100% 97%    Intake/Output from previous day:   Intake/Output Summary (Last 24 hours) at 04/10/11 1627 Last data filed at 04/10/11 1426  Gross per 24 hour  Intake    243 ml  Output    150 ml  Net     93 ml    Physical Exam: General: Alert, awake, oriented x3, no acute distress. HEENT: No bruits, no goiter. Moist mucous membranes, no scleral icterus, no conjunctival pallor. Heart: Regular rate and rhythm, S1/S2 +, no murmurs, rubs, gallops. Lungs: Wheezing appreciated over upper lung lobes Abdomen: Soft, nontender, nondistended, positive bowel sounds. Extremities: No clubbing or  cyanosis, no pitting edema,  positive pedal pulses. Neuro: Grossly nonfocal.  Lab Results:  Basic Metabolic Panel:    Component Value Date/Time   NA 138 04/10/2011 0130   K 4.1 04/10/2011 0130   CL 98 04/10/2011 0130   CO2 28 04/10/2011 0130   BUN 18 04/10/2011 0130   CREATININE 6.26* 04/10/2011 0130   GLUCOSE 116* 04/10/2011 0130   CALCIUM 10.4 04/10/2011 0130   CALCIUM 8.7 03/03/2009 0624   CBC:    Component Value Date/Time   WBC 12.6* 04/10/2011 0130   HGB 12.3* 04/10/2011 0130   HCT 37.1* 04/10/2011 0130   PLT 391 04/10/2011 0130    Lab 04/10/11 0130 04-17-2011 1908  WBC 12.6* 11.8*  HGB 12.3* 12.7*  HCT 37.1* 38.2*  PLT 391 383    Lab 04/10/11 0130 April 17, 2011 1908  NA 138 135  K 4.1 4.0  CL 98 95*  CO2 28 29  GLUCOSE 116* 135*  BUN 18 14  CREATININE 6.26* 5.55*  CALCIUM 10.4 10.0   Cardiac markers:  Lab 04/10/11 0915 04/10/11 0137  CKMB 2.9 2.8  TROPONINI <0.30 <0.30    Studies/Results: Dg Chest 2 View 04-17-11 IMPRESSION: Chronic cardiomegaly.  Development of small areas of patchy density both lower lobes could reflect mild pneumonia.    Medications: Scheduled Meds:   . amLODipine  10 mg Oral Daily  . aspirin EC  325 mg Oral Daily  . cephALEXin  500 mg Oral Q6H  . cloNIDine  0.1 mg Oral BID  . docusate sodium  100 mg Oral BID  . heparin  5,000 Units Subcutaneous Q8H  . levofloxacin (LEVAQUIN) IV  500 mg Intravenous Q M,W,F-2000  . lisinopril  20 mg Oral Daily  . senna  1 tablet Oral BID  . sodium chloride  3 mL Intravenous Q12H  . sodium chloride  3 mL Intravenous Q12H   Continuous Infusions:  PRN Meds:.sodium chloride, acetaminophen, acetaminophen, nitroGLYCERIN, ondansetron (ZOFRAN) IV, ondansetron, sodium chloride   LOS: 1 day   Jo Booze 04/10/2011, 4:27 PM  [facility] Pager: [phone]

## 2011-04-10 NOTE — ED Notes (Signed)
Dr Bonno at bedside. 

## 2011-04-10 NOTE — Progress Notes (Signed)
PHARMACY - ANTIBIOTIC CONSULT  INITIAL NOTE  Pharmacy Consult for: Levofloxacin  Indication: R/o CAP   Patient Data:   Allergies: No Known Allergies  Patient Measurements:   Adjusted Body Weight:   Vital Signs: Temp:  [98 F (36.7 C)-98.1 F (36.7 C)] 98 F (36.7 C) (02/20 1842) Pulse Rate:  [75-79] 75  (02/20 1842) Resp:  [16-22] 22  (02/20 1842) BP: (147-152)/(76-103) 147/76 mmHg (02/20 1842) SpO2:  [97 %-99 %] 99 % (02/20 1842)  Intake/Output from previous day: No intake or output data in the 24 hours ending 04/10/11 0144  Labs:  [facility] 04/09/11 1908  WBC 11.8*  HGB 12.7*  PLT 383  LABCREA --  CREATININE 5.55*   The CrCl is unknown because both a height and weight (above a minimum accepted value) are required for this calculation. No results found for this basename: VANCOTROUGH:2,VANCOPEAK:2,VANCORANDOM:2,GENTTROUGH:2,GENTPEAK:2,GENTRANDOM:2,TOBRATROUGH:2,TOBRAPEAK:2,TOBRARND:2,AMIKACINPEAK:2,AMIKACINTROU:2,AMIKACIN:2, in the last 72 hours   Microbiology: No results found for this or any previous visit (from the past 720 hour(s)).  Medical History: Past Medical History  Diagnosis Date  . Diabetes mellitus   . ESRD on dialysis   . Hypertension   . Stroke     States he had 3 strokes at once  . Coronary artery disease     Coronary calcification seen on chest CT. Pt denies MI's     Scheduled Medications:     . amLODipine  10 mg Oral Daily  . aspirin EC  325 mg Oral Daily  . cloNIDine  0.1 mg Oral BID  . docusate sodium  100 mg Oral BID  . heparin  5,000 Units Subcutaneous Q8H  . lisinopril  20 mg Oral Daily  . senna  1 tablet Oral BID  . sodium chloride  3 mL Intravenous Q12H  . sodium chloride  3 mL Intravenous Q12H  . DISCONTD: levofloxacin (LEVAQUIN) IV  750 mg Intravenous To Minor      Assessment:  51 y.o. male with ESRD-HD MWF admitted on 04/09/2011, with r/o CAP. Pharmacy consulted to manage levofloxacin. Patient has already received  levofloxacin 750mg  IV x 1.     Plan:  1. Levofloxacin 500mg  IV Q-MWF evening after HD.    Dineen Kid Thad Ranger, PharmD 04/10/2011, 1:44 AM

## 2011-04-11 ENCOUNTER — Inpatient Hospital Stay (HOSPITAL_COMMUNITY): Payer: 59

## 2011-04-11 DIAGNOSIS — I517 Cardiomegaly: Secondary | ICD-10-CM

## 2011-04-11 LAB — BASIC METABOLIC PANEL
GFR calc Af Amer: 8 mL/min — ABNORMAL LOW (ref >90)
GFR calc non Af Amer: 6 mL/min — ABNORMAL LOW (ref >90)
Potassium: 4.5 mEq/L (ref 3.5–5.1)
Sodium: 136 mEq/L (ref 135–145)

## 2011-04-11 LAB — CBC
MCHC: 32.6 g/dL (ref 30.0–36.0)
RDW: 13.7 % (ref 11.5–15.5)

## 2011-04-11 MED ORDER — CLONIDINE HCL 0.1 MG PO TABS
0.1000 mg | ORAL_TABLET | Freq: Three times a day (TID) | ORAL | Status: DC
  Administered 2011-04-11 – 2011-04-12 (×3): 0.1 mg via ORAL
  Filled 2011-04-11 (×7): qty 1

## 2011-04-11 MED ORDER — HEPARIN SODIUM (PORCINE) 1000 UNIT/ML DIALYSIS
20.0000 [IU]/kg | INTRAMUSCULAR | Status: DC | PRN
  Administered 2011-04-11: 2900 [IU] via INTRAVENOUS_CENTRAL
  Filled 2011-04-11: qty 3

## 2011-04-11 MED ORDER — HYDRALAZINE HCL 20 MG/ML IJ SOLN
10.0000 mg | INTRAMUSCULAR | Status: DC | PRN
Start: 2011-04-11 — End: 2011-04-12
  Administered 2011-04-12: 10 mg via INTRAVENOUS
  Filled 2011-04-11 (×2): qty 0.5

## 2011-04-11 NOTE — Progress Notes (Signed)
  Echocardiogram 2D Echocardiogram has been performed.  Juanita Laster Chelcy Bolda 04/11/2011, 3:18 PM

## 2011-04-11 NOTE — Progress Notes (Signed)
   CARE MANAGEMENT NOTE 04/11/2011  Patient:  Gabriel Bell, Gabriel Bell   Account Number:  [id number]  Date Initiated:  04/11/2011  Documentation initiated by:  Donn Pierini  Subjective/Objective Assessment:   Pt admitted with chest pain, hx ESRD     Action/Plan:   PTA pt lived at home with spouse, independent with ADLs   Anticipated DC Date:  04/11/2011   Anticipated DC Plan:  HOME/SELF CARE      DC Planning Services  CM consult      Choice offered to / List presented to:             Status of service:  In process, will continue to follow Medicare Important Message given?   (If response is "NO", the following Medicare IM given date fields will be blank) Date Medicare IM given:   Date Additional Medicare IM given:    Discharge Disposition:    Per UR Regulation:    Comments:  PCP- [facility], neph.Bascom Levels  04/11/11- 1420- Donn Pierini RN, BSN [phone] Pt for possible d/c today after HD, no anticipated d/c needs. CM following

## 2011-04-11 NOTE — Progress Notes (Signed)
Pt to be transferred to room 6707 after dialysis, pt still in dialysis at this time, report given to  Bernie Covey RN.   Helyn App Smyer

## 2011-04-11 NOTE — Progress Notes (Signed)
Patient ID: Gabriel Bell, male   DOB: 14-Apr-1960, 51 y.o.   MRN: [id number]  Assessment/Plan:   Principal Problem:   *Chest pain  - likely musculoskeletal in origin  - cardiac enzymes are negative so far  - follow up 2 D ECHO - grade I diastolic dysfunction, normal EF - obtain pro BNP   Active Problems:   Community acquired pneumonia  - continue Levaquin  - blood cultures to date are negative and patient remains afebrile   ESRD (end stage renal disease)  - HD MWF   Hypertension  - Continue norvasc 10 mg daily, clonidine and lisinopril  - BP elevated today as patient has been awaiting HD  EDUCATION  - test results and diagnostic studies were discussed with patientat the bedside  - patient has verbalized the understanding  - questions were answered at the bedside and contact information was provided for additional questions or concerns     Subjective: No events overnight. Patient denies chest pain, shortness of breath, abdominal pain. Had bowel movement and reports ambulating.  Objective:  Vital signs in last 24 hours:  Filed Vitals:   04/11/11 2030 04/11/11 2101 04/11/11 2131 04/11/11 2157  BP: 157/86 166/86 177/98 184/98  Pulse: 80 80 82 79  Temp:      TempSrc:      Resp: 24 22 19 18   Height:      Weight:      SpO2:        Intake/Output from previous day:   Intake/Output Summary (Last 24 hours) at 04/11/11 2226 Last data filed at 04/11/11 1821  Gross per 24 hour  Intake    960 ml  Output   1000 ml  Net    -40 ml    Physical Exam: General: Alert, awake, oriented x3, in no acute distress. HEENT: No bruits, no goiter. Moist mucous membranes, no scleral icterus, no conjunctival pallor. Heart: Regular rate and rhythm, S1/S2 +, no murmurs, rubs, gallops. Lungs: Clear to auscultation bilaterally. No wheezing, no rhonchi, no rales.  Abdomen: Soft, nontender, nondistended, positive bowel sounds. Extremities: No clubbing or cyanosis, no pitting edema,   positive pedal pulses. Neuro: Grossly nonfocal.  Lab Results:  Basic Metabolic Panel:    Component Value Date/Time   NA 136 04/11/2011 0535   K 4.5 04/11/2011 0535   CL 98 04/11/2011 0535   CO2 23 04/11/2011 0535   BUN 32* 04/11/2011 0535   CREATININE 8.44* 04/11/2011 0535   GLUCOSE 106* 04/11/2011 0535   CALCIUM 10.0 04/11/2011 0535   CALCIUM 8.7 03/03/2009 0624   CBC:    Component Value Date/Time   WBC 8.0 04/11/2011 0535   HGB 11.1* 04/11/2011 0535   HCT 34.1* 04/11/2011 0535   PLT 344 04/11/2011 0535   MCV 93.7 04/11/2011 0535   NEUTROABS 10.0* 03/02/2011 2152   LYMPHSABS 2.6 03/02/2011 2152   MONOABS 1.3* 03/02/2011 2152   EOSABS 0.7 03/02/2011 2152   BASOSABS 0.0 03/02/2011 2152      Lab 04/11/11 0535 04/10/11 0130 04/09/11 1908  WBC 8.0 12.6* 11.8*  HGB 11.1* 12.3* 12.7*  HCT 34.1* 37.1* 38.2*  PLT 344 391 383  MCV 93.7 93.7 93.2  MCH 30.5 31.1 31.0  MCHC 32.6 33.2 33.2  RDW 13.7 14.1 13.9  LYMPHSABS -- -- --  MONOABS -- -- --  EOSABS -- -- --  BASOSABS -- -- --  BANDABS -- -- --    Lab 04/11/11 0535 04/10/11 0130 04/09/11 1908  NA 136 138  135  K 4.5 4.1 4.0  CL 98 98 95*  CO2 23 28 29   GLUCOSE 106* 116* 135*  BUN 32* 18 14  CREATININE 8.44* 6.26* 5.55*  CALCIUM 10.0 10.4 10.0  MG -- -- --   No results found for this basename: INR:5,PROTIME:5 in the last 168 hours Cardiac markers:  Lab 04/10/11 1735 04/10/11 0915 04/10/11 0137  CKMB 3.0 2.9 2.8  TROPONINI <0.30 <0.30 <0.30  MYOGLOBIN -- -- --   No components found with this basename: POCBNP:3 No results found for this or any previous visit (from the past 240 hour(s)).  Studies/Results: No results found.  Medications: Scheduled Meds:   . amLODipine  10 mg Oral Daily  . aspirin EC  325 mg Oral Daily  . cloNIDine  0.1 mg Oral TID  . docusate sodium  100 mg Oral BID  . heparin  5,000 Units Subcutaneous Q8H  . levofloxacin (LEVAQUIN) IV  500 mg Intravenous Q M,W,F-2000  . lisinopril  20 mg Oral Daily   . senna  1 tablet Oral BID  . sodium chloride  3 mL Intravenous Q12H  . sodium chloride  3 mL Intravenous Q12H  . DISCONTD: cloNIDine  0.1 mg Oral BID   Continuous Infusions:  PRN Meds:.sodium chloride, acetaminophen, acetaminophen, heparin, hydrALAZINE, nitroGLYCERIN, ondansetron (ZOFRAN) IV, ondansetron, sodium chloride    LOS: 2 days   Roye Gustafson 04/11/2011, 10:26 PM  [facility] Pager: [phone]

## 2011-04-11 NOTE — Progress Notes (Signed)
Pt's BP=180/98, pt resting no c/o pain. Maren Reamer NP notified and ordered clonidine 0.1mg  po TID  . Will continue to monitor.

## 2011-04-12 LAB — GLUCOSE, CAPILLARY: Glucose-Capillary: 94 mg/dL (ref 70–99)

## 2011-04-12 MED ORDER — ASPIRIN 81 MG PO CHEW: 162.0000 mg | CHEWABLE_TABLET | Freq: Every day | ORAL | Status: DC | PRN

## 2011-04-12 MED ORDER — AMLODIPINE BESYLATE 10 MG PO TABS: 10.0000 mg | ORAL_TABLET | Freq: Every day | ORAL | Status: DC

## 2011-04-12 MED ORDER — LEVOFLOXACIN 750 MG PO TABS: 750.0000 mg | ORAL_TABLET | Freq: Every day | ORAL | Status: AC

## 2011-04-12 MED ORDER — CLONIDINE HCL 0.2 MG PO TABS
0.2000 mg | ORAL_TABLET | Freq: Three times a day (TID) | ORAL | Status: DC
  Filled 2011-04-12 (×2): qty 1

## 2011-04-12 MED ORDER — HYDROXYZINE HCL 25 MG PO TABS: 25.0000 mg | ORAL_TABLET | Freq: Three times a day (TID) | ORAL | Status: DC | PRN

## 2011-04-12 MED ORDER — DSS 100 MG PO CAPS: 100.0000 mg | ORAL_CAPSULE | Freq: Two times a day (BID) | ORAL | Status: AC

## 2011-04-12 MED ORDER — LISINOPRIL 20 MG PO TABS: 20.0000 mg | ORAL_TABLET | Freq: Every day | ORAL | Status: DC

## 2011-04-12 MED ORDER — CLONIDINE HCL 0.1 MG PO TABS: 0.2000 mg | ORAL_TABLET | Freq: Two times a day (BID) | ORAL | Status: DC

## 2011-04-12 NOTE — Discharge Summary (Signed)
Patient ID: Gabriel Bell MRN: [id number] DOB/AGE: 1960-09-29 51 y.o.  Admit date: 04/09/2011 Discharge date: 04/12/2011  Primary Care Physician:  Dorrene German, MD, MD  Assessment/Plan:   Principal Problem:   *Chest pain  - likely musculoskeletal in origin  - cardiac enzymes are negative so far  - follow up 2 D ECHO - grade I diastolic dysfunction, normal EF  - BNP level at 1,070  Active Problems:   Community acquired pneumonia  - continue Levaquin  - blood cultures to date are negative and patient remains afebrile  - continue Levaquin for 10 days upon discharge  ESRD (end stage renal disease)  - HD MWF   Hypertension  - Continue norvasc 10 mg daily, clonidine and lisinopril  - BP elevated today as patient has been awaiting HD   EDUCATION  - test results and diagnostic studies were discussed with patientat the bedside  - patient has verbalized the understanding  - questions were answered at the bedside and contact information was provided for additional questions or concerns       Medication List  As of 04/12/2011  2:12 PM   STOP taking these medications         cephALEXin 500 MG capsule         TAKE these medications         amLODipine 10 MG tablet   Commonly known as: NORVASC   Take 1 tablet (10 mg total) by mouth daily.      aspirin 81 MG chewable tablet   Chew 2 tablets (162 mg total) by mouth daily as needed. For pain      cloNIDine 0.1 MG tablet   Commonly known as: CATAPRES   Take 2 tablets (0.2 mg total) by mouth 2 (two) times daily.      DSS 100 MG Caps   Take 100 mg by mouth 2 (two) times daily.      hydrOXYzine 25 MG tablet   Commonly known as: ATARAX/VISTARIL   Take 1 tablet (25 mg total) by mouth 3 (three) times daily as needed. For itching      levofloxacin 750 MG tablet   Commonly known as: LEVAQUIN   Take 1 tablet (750 mg total) by mouth daily.      lisinopril 20 MG tablet   Commonly known as: PRINIVIL,ZESTRIL   Take 1  tablet (20 mg total) by mouth daily.            Disposition and Follow-up: - Patient is medically stable and clinically appears well to be discharged home - Prescriptions for Levaquin as well as all the patient's home medication were provided at the patient's request  Consults:   1. Renal (Dr. Jamey Ripa)  Significant Diagnostic Studies:  Dg Chest 2 View  04/09/2011  *RADIOLOGY REPORT*  Clinical Data: Short of breath.  Hypertension.  CHEST - 2 VIEW  Comparison: 03/18/2011  Findings: Cardiomegaly persist with left ventricular prominence. The aorta is unfolded.  There is development of new patchy densities in both lower lobes.  These could be small areas of infiltrate or volume loss.  No effusions.  No bony abnormalities.  IMPRESSION: Chronic cardiomegaly.  Development of small areas of patchy density both lower lobes could reflect mild pneumonia.  Original Report Authenticated By: Thomasenia Sales, M.D.    Brief H and P: 51 year old male with history of end-stage renal disease on hemodialysis (Monday Wednesday Friday), hypertension who presented to emergency room with complaints of chest pain shortly after completion of  hemodialysis. Chest pain was retrosternal, 5-6/10 in intensity, non radiating, started at rest. Patient reported associated cough productive of thick whitish sputum. The patient reported that he has been feeling short of breath for about 2-3 days prior to the admission associated with subjective fevers and chills at home. Patient reports no palpitations, diaphoresis. No complaints of abdominal pain, nausea or vomiting. No complaints of blood in the stool or urine.  Physical Exam on Discharge:  Filed Vitals:   04/12/11 0100 04/12/11 0645 04/12/11 0925  BP: 223/100 176/92 174/91  Pulse: 85 74 72  Temp: 98.5 F (36.9 C) 97.9 F (36.6 C) 98.3 F (36.8 C)  TempSrc: Oral Oral Oral  Resp: 20 20 19   Height:     Weight:     SpO2: 99% 98% 99%     Gross per 24 hour  Intake    1310 ml  Output   4232 ml  Net  -2922 ml    General: Alert, awake, oriented x3, in no acute distress. HEENT: No bruits, no goiter. Heart: Regular rate and rhythm, without murmurs, rubs, gallops. Lungs: Clear to auscultation bilaterally. Abdomen: Soft, nontender, nondistended, positive bowel sounds. Extremities: No clubbing cyanosis or edema with positive pedal pulses. Neuro: Grossly intact, nonfocal.  CBC:  WBC 8.0 04/11/2011 0535   HGB 11.1* 04/11/2011 0535   HCT 34.1* 04/11/2011 0535   PLT 344 04/11/2011 0535   MCV 93.7 04/11/2011 0535    Basic Metabolic Panel:  NA 136 04/11/2011 0535   K 4.5 04/11/2011 0535   CL 98 04/11/2011 0535   CO2 23 04/11/2011 0535   BUN 32* 04/11/2011 0535   CREATININE 8.44* 04/11/2011 0535   GLUCOSE 106* 04/11/2011 0535   CALCIUM 10.0 04/11/2011 0535   CALCIUM 8.7 03/03/2009 0624     Time spent on Discharge: Greater than 30 minutes  Signed: Gissel Keilman 04/12/2011, 2:12 PM

## 2011-04-12 NOTE — Progress Notes (Signed)
Patient dischagre home.  Discharge instruction given and explained to patient, copies of all forms given and explained. Medication time and dose reviewed with patient, voiced understanding of all instructions.

## 2011-04-12 NOTE — Discharge Instructions (Signed)
Chest Pain (Nonspecific) It is often hard to give a specific diagnosis for the cause of chest pain. There is always a chance that your pain could be related to something serious, such as a heart attack or a blood clot in the lungs. You need to follow up with your caregiver for further evaluation. CAUSES   Heartburn.   Pneumonia or bronchitis.   Anxiety and stress.   Inflammation around your heart (pericarditis) or lung (pleuritis or pleurisy).   A blood clot in the lung.   A collapsed lung (pneumothorax). It can develop suddenly on its own (spontaneous pneumothorax) or from injury (trauma) to the chest.  The chest wall is composed of bones, muscles, and cartilage. Any of these can be the source of the pain.  The bones can be bruised by injury.   The muscles or cartilage can be strained by coughing or overwork.   The cartilage can be affected by inflammation and become sore (costochondritis).  DIAGNOSIS  Lab tests or other studies, such as X-rays, an EKG, stress testing, or cardiac imaging, may be needed to find the cause of your pain.  TREATMENT   Treatment depends on what may be causing your chest pain. Treatment may include:   Acid blockers for heartburn.   Anti-inflammatory medicine.   Pain medicine for inflammatory conditions.   Antibiotics if an infection is present.   You may be advised to change lifestyle habits. This includes stopping smoking and avoiding caffeine and chocolate.   You may be advised to keep your head raised (elevated) when sleeping. This reduces the chance of acid going backward from your stomach into your esophagus.   Most of the time, nonspecific chest pain will improve within 2 to 3 days with rest and mild pain medicine.  HOME CARE INSTRUCTIONS   If antibiotics were prescribed, take the full amount even if you start to feel better.   For the next few days, avoid physical activities that bring on chest pain. Continue physical activities as  directed.   Do not smoke cigarettes or drink alcohol until your symptoms are gone.   Only take over-the-counter or prescription medicine for pain, discomfort, or fever as directed by your caregiver.   Follow your caregiver's suggestions for further testing if your chest pain does not go away.   Keep any follow-up appointments you made. If you do not go to an appointment, you could develop lasting (chronic) problems with pain. If there is any problem keeping an appointment, you must call to reschedule.  SEEK MEDICAL CARE IF:   You think you are having problems from the medicine you are taking. Read your medicine instructions carefully.   Your chest pain does not go away, even after treatment.   You develop a rash with blisters on your chest.  SEEK IMMEDIATE MEDICAL CARE IF:   You have increased chest pain or pain that spreads to your arm, neck, jaw, back, or belly (abdomen).   You develop shortness of breath, an increasing cough, or you are coughing up blood.   You have severe back or abdominal pain, feel sick to your stomach (nauseous) or throw up (vomit).   You develop severe weakness, fainting, or chills.   You have an oral temperature above 102 F (38.9 C), not controlled by medicine.  THIS IS AN EMERGENCY. Do not wait to see if the pain will go away. Get medical help at once. Call your local emergency services (911 in [place] Do not drive yourself to   the hospital. MAKE SURE YOU:   Understand these instructions.   Will watch your condition.   Will get help right away if you are not doing well or get worse.  Document Released: 11/13/2004 Document Revised: 10/16/2010 Document Reviewed: 09/09/2007 [employer] Patient Information 2012 [employer]. 

## 2011-04-29 ENCOUNTER — Encounter: Payer: Self-pay | Admitting: Cardiovascular Disease

## 2011-05-27 ENCOUNTER — Emergency Department (HOSPITAL_COMMUNITY): Payer: 59

## 2011-05-27 ENCOUNTER — Other Ambulatory Visit: Payer: Self-pay

## 2011-05-27 ENCOUNTER — Encounter (HOSPITAL_COMMUNITY): Payer: Self-pay

## 2011-05-27 ENCOUNTER — Emergency Department (HOSPITAL_COMMUNITY)
Admission: EM | Admit: 2011-05-27 | Discharge: 2011-05-27 | Disposition: A | Discharge status: Home / Self Care | Payer: 59 | Attending: Emergency Medicine | Admitting: Emergency Medicine

## 2011-05-27 DIAGNOSIS — I2510 Atherosclerotic heart disease of native coronary artery without angina pectoris: Secondary | ICD-10-CM | POA: Insufficient documentation

## 2011-05-27 DIAGNOSIS — IMO0002 Reserved for concepts with insufficient information to code with codable children: Secondary | ICD-10-CM | POA: Insufficient documentation

## 2011-05-27 DIAGNOSIS — Z79899 Other long term (current) drug therapy: Secondary | ICD-10-CM | POA: Insufficient documentation

## 2011-05-27 DIAGNOSIS — Z992 Dependence on renal dialysis: Secondary | ICD-10-CM | POA: Insufficient documentation

## 2011-05-27 DIAGNOSIS — Z8673 Personal history of transient ischemic attack (TIA), and cerebral infarction without residual deficits: Secondary | ICD-10-CM | POA: Insufficient documentation

## 2011-05-27 DIAGNOSIS — N186 End stage renal disease: Secondary | ICD-10-CM | POA: Insufficient documentation

## 2011-05-27 DIAGNOSIS — Z7982 Long term (current) use of aspirin: Secondary | ICD-10-CM | POA: Insufficient documentation

## 2011-05-27 DIAGNOSIS — I120 Hypertensive chronic kidney disease with stage 5 chronic kidney disease or end stage renal disease: Secondary | ICD-10-CM | POA: Insufficient documentation

## 2011-05-27 DIAGNOSIS — R079 Chest pain, unspecified: Secondary | ICD-10-CM | POA: Insufficient documentation

## 2011-05-27 DIAGNOSIS — E119 Type 2 diabetes mellitus without complications: Secondary | ICD-10-CM | POA: Insufficient documentation

## 2011-05-27 LAB — BASIC METABOLIC PANEL
BUN: 30 mg/dL — ABNORMAL HIGH (ref 6–23)
CO2: 24 mEq/L (ref 19–32)
Calcium: 8.8 mg/dL (ref 8.4–10.5)
Chloride: 97 mEq/L (ref 96–112)
Creatinine, Ser: 8.26 mg/dL — ABNORMAL HIGH (ref 0.50–1.35)
GFR calc Af Amer: 8 mL/min — ABNORMAL LOW (ref >90)
GFR calc non Af Amer: 7 mL/min — ABNORMAL LOW (ref >90)
Glucose, Bld: 145 mg/dL — ABNORMAL HIGH (ref 70–99)
Potassium: 3.4 mEq/L — ABNORMAL LOW (ref 3.5–5.1)
Sodium: 137 mEq/L (ref 135–145)

## 2011-05-27 LAB — DIFFERENTIAL
Basophils Absolute: 0 10*3/uL (ref 0.0–0.1)
Basophils Relative: 0 % (ref 0–1)
Eosinophils Absolute: 0.2 10*3/uL (ref 0.0–0.7)
Eosinophils Relative: 2 % (ref 0–5)
Lymphocytes Relative: 23 % (ref 12–46)
Lymphs Abs: 2.4 10*3/uL (ref 0.7–4.0)
Monocytes Absolute: 0.9 10*3/uL (ref 0.1–1.0)
Monocytes Relative: 8 % (ref 3–12)
Neutro Abs: 6.9 10*3/uL (ref 1.7–7.7)
Neutrophils Relative %: 67 % (ref 43–77)

## 2011-05-27 LAB — TROPONIN I: Troponin I: <0.3 ng/mL (ref <0.30)

## 2011-05-27 LAB — CBC
HCT: 32.2 % — ABNORMAL LOW (ref 39.0–52.0)
Hemoglobin: 10.9 g/dL — ABNORMAL LOW (ref 13.0–17.0)
MCH: 29.8 pg (ref 26.0–34.0)
MCHC: 33.9 g/dL (ref 30.0–36.0)
MCV: 88 fL (ref 78.0–100.0)
Platelets: 402 10*3/uL — ABNORMAL HIGH (ref 150–400)
RBC: 3.66 MIL/uL — ABNORMAL LOW (ref 4.22–5.81)
RDW: 13.6 % (ref 11.5–15.5)
WBC: 10.4 10*3/uL (ref 4.0–10.5)

## 2011-05-27 MED ORDER — MORPHINE SULFATE 4 MG/ML IJ SOLN
6.0000 mg | Freq: Once | INTRAMUSCULAR | Status: AC
  Administered 2011-05-27: 6 mg via INTRAVENOUS
  Filled 2011-05-27: qty 2

## 2011-05-27 MED ORDER — ONDANSETRON HCL 4 MG/2ML IJ SOLN
4.0000 mg | Freq: Once | INTRAMUSCULAR | Status: AC
  Administered 2011-05-27: 4 mg via INTRAVENOUS
  Filled 2011-05-27: qty 2

## 2011-05-27 NOTE — ED Notes (Signed)
EMS reports pt from dialysis, finished his treatment, then reported that they were trying to harm him, pt with hx of schizophrenia, 150/114,  Heart rate 80 bpm

## 2011-05-27 NOTE — ED Notes (Signed)
[place]- Chiropodist in to speak with patient.  Patient refusing to leave room until 6 pm when wife gets off work.  Explained to patient that test results were negative and he could wait in the waiting room.  Patient requesting money for ambulance to take him back.  Explained that hospital does not pay for return ride home.  Assisted patient with dressing and patient was taken to waiting room.  He refused wheelchair, requested mask but then refused to wear mask.

## 2011-05-27 NOTE — ED Provider Notes (Signed)
History     [name]: [id number]  Arrival date & time 05/27/11  1306   First MD Initiated Contact with Patient 05/27/11 1312      Chief Complaint  Patient presents with  . Chest Pain     HPI Patient resists the emergency department with chest pain.  He states is always present, but today after dialysis, it seemed worse.  Patient denies shortness of breath, weakness, numbness, headache, visual changes, abdominal pain, or radiation of his pain.  Patient states that at dialysis they were trying to harm him.  Patient has been worked up for chest pain in the recent past, which was negative.  Patient is a very cooperative on my questioning, some of these details were gained from his previous charts. Past Medical History  Diagnosis Date  . Diabetes mellitus   . ESRD on dialysis   . Hypertension   . Stroke     States he had 3 strokes at once  . Coronary artery disease     Coronary calcification seen on chest CT. Pt denies MI's     History reviewed. No pertinent past surgical history.  Family History  Problem Relation Age of Onset  . Heart disease Mother   . Heart disease Father     History  Substance Use Topics  . Smoking status: Never Smoker   . Smokeless tobacco: Not on file  . Alcohol Use: No      Review of Systems All pertinent positives and negatives reviewed in the history of present illness  Allergies  Review of patient's allergies indicates no known allergies.  Home Medications   Current Outpatient Rx  Name Route Sig Dispense Refill  . AMLODIPINE BESYLATE 10 MG PO TABS Oral Take 1 tablet (10 mg total) by mouth daily. 30 tablet 0  . ASPIRIN EC 81 MG PO TBEC Oral Take 162 mg by mouth daily.    Marland Kitchen CLONIDINE HCL 0.1 MG PO TABS Oral Take 2 tablets (0.2 mg total) by mouth 2 (two) times daily. 60 tablet 0  . HYDROXYZINE HCL 25 MG PO TABS Oral Take 1 tablet (25 mg total) by mouth 3 (three) times daily as needed. For itching 30 tablet 0  . LISINOPRIL 20 MG PO TABS Oral Take  1 tablet (20 mg total) by mouth daily. 30 tablet 0    BP 175/79  Pulse 81  Temp(Src) 98.1 F (36.7 C) (Oral)  Resp 20  SpO2 98%  Physical Exam  Constitutional: He appears well-developed and well-nourished. No distress.  HENT:  Head: Normocephalic and atraumatic.  Mouth/Throat: No oropharyngeal exudate.  Eyes: Pupils are equal, round, and reactive to light.  Neck: Normal range of motion. Neck supple.  Cardiovascular: Normal rate, regular rhythm and normal heart sounds.  Exam reveals no gallop and no friction rub.   No murmur heard. Pulmonary/Chest: Effort normal and breath sounds normal. No respiratory distress. He has no wheezes. He has no rales. He exhibits tenderness.  Musculoskeletal:       Arms: Skin: Skin is warm and dry. No rash noted. No erythema.  Psychiatric: He is agitated.    ED Course  Procedures (including critical care time)  Labs Reviewed  CBC - Abnormal; Notable for the following:    RBC 3.66 (*)    Hemoglobin 10.9 (*)    HCT 32.2 (*)    Platelets 402 (*)    All other components within normal limits  BASIC METABOLIC PANEL - Abnormal; Notable for the following:  Potassium 3.4 (*)    Glucose, Bld 145 (*)    BUN 30 (*)    Creatinine, Ser 8.26 (*)    GFR calc non Af Amer 7 (*)    GFR calc Af Amer 8 (*)    All other components within normal limits  DIFFERENTIAL  TROPONIN I   Dg Chest Port 1 View  05/27/2011  *RADIOLOGY REPORT*  Clinical Data: Chest pain.  Hypertension, diabetes, end-stage renal disease and coronary artery disease.  PORTABLE CHEST - 1 VIEW  Comparison: 04/09/2011 chest radiograph and 03/27/2011 chest CT.  Findings: Stable cardiomegaly.  Stable mediastinal and hilar contours.  Atherosclerotic calcification of the thoracic aortic arch.  Normal pulmonary vascularity.  Lung volumes are slightly low.  The lungs are clear.  No acute or suspicious bony abnormality.  IMPRESSION: Cardiomegaly.  No acute findings.  Original Report Authenticated By:  Britta Mccreedy, M.D.   the patient has constant, nonradiating worse with movement and palpation, chest pain.  The patient is in worked up recently after doing his previous charts for this chest pain.  Patient is requesting food and drink at this time.  Patient is not in any distress and his pain is relieved with morphine.  I have advised him to follow up with primary care doctor for recheck.  Dr. Denton Lank saw the patient as well, but this was atypical chest pain.  Patient is advised to  Return here for any worsening his condition     MDM       Date: 05/27/2011  Rate: 76  Rhythm: normal sinus rhythm  QRS Axis: left  Intervals: normal  ST/T Wave abnormalities: normal  Conduction Disutrbances:none  Narrative Interpretation:   Old EKG Reviewed: unchanged      Carlyle Dolly, PA-C 05/27/11 1606  Carlyle Dolly, PA-C 05/27/11 [phone]

## 2011-05-27 NOTE — ED Notes (Signed)
Discharge instructions given to patient.

## 2011-05-27 NOTE — Discharge Instructions (Signed)
Return here as needed for any worsening in her condition.  Follow up with your doctor for recheck in the next one to 2 days.

## 2011-05-27 NOTE — ED Notes (Signed)
Patient requesting to speak with charge nurse concerning being discharged to waiting room.  Wants to stay in room until 6 pm when wife gets off work.  Charge nurse notified of same.

## 2011-06-14 ENCOUNTER — Emergency Department (HOSPITAL_COMMUNITY): Payer: 59

## 2011-06-14 ENCOUNTER — Emergency Department (HOSPITAL_COMMUNITY)
Admission: EM | Admit: 2011-06-14 | Discharge: 2011-06-14 | Disposition: A | Discharge status: Home / Self Care | Payer: 59 | Attending: Emergency Medicine | Admitting: Emergency Medicine

## 2011-06-14 DIAGNOSIS — R071 Chest pain on breathing: Secondary | ICD-10-CM | POA: Insufficient documentation

## 2011-06-14 DIAGNOSIS — Z992 Dependence on renal dialysis: Secondary | ICD-10-CM | POA: Insufficient documentation

## 2011-06-14 DIAGNOSIS — N186 End stage renal disease: Secondary | ICD-10-CM | POA: Insufficient documentation

## 2011-06-14 DIAGNOSIS — E119 Type 2 diabetes mellitus without complications: Secondary | ICD-10-CM | POA: Insufficient documentation

## 2011-06-14 DIAGNOSIS — Z8673 Personal history of transient ischemic attack (TIA), and cerebral infarction without residual deficits: Secondary | ICD-10-CM | POA: Insufficient documentation

## 2011-06-14 DIAGNOSIS — R0789 Other chest pain: Secondary | ICD-10-CM

## 2011-06-14 DIAGNOSIS — R079 Chest pain, unspecified: Secondary | ICD-10-CM | POA: Insufficient documentation

## 2011-06-14 DIAGNOSIS — I2510 Atherosclerotic heart disease of native coronary artery without angina pectoris: Secondary | ICD-10-CM | POA: Insufficient documentation

## 2011-06-14 DIAGNOSIS — I120 Hypertensive chronic kidney disease with stage 5 chronic kidney disease or end stage renal disease: Secondary | ICD-10-CM | POA: Insufficient documentation

## 2011-06-14 LAB — CBC
MCV: 92.4 fL (ref 78.0–100.0)
Platelets: 385 10*3/uL (ref 150–400)
RBC: 3.4 MIL/uL — ABNORMAL LOW (ref 4.22–5.81)
RDW: 14 % (ref 11.5–15.5)
WBC: 13 10*3/uL — ABNORMAL HIGH (ref 4.0–10.5)

## 2011-06-14 LAB — COMPREHENSIVE METABOLIC PANEL
ALT: 19 U/L (ref 0–53)
AST: 19 U/L (ref 0–37)
Alkaline Phosphatase: 99 U/L (ref 39–117)
CO2: 28 mEq/L (ref 19–32)
Chloride: 95 mEq/L — ABNORMAL LOW (ref 96–112)
GFR calc Af Amer: 10 mL/min — ABNORMAL LOW (ref >90)
GFR calc non Af Amer: 9 mL/min — ABNORMAL LOW (ref >90)
Glucose, Bld: 101 mg/dL — ABNORMAL HIGH (ref 70–99)
Potassium: 4.1 mEq/L (ref 3.5–5.1)
Sodium: 135 mEq/L (ref 135–145)
Total Bilirubin: 0.2 mg/dL — ABNORMAL LOW (ref 0.3–1.2)

## 2011-06-14 LAB — DIFFERENTIAL
Basophils Absolute: 0.1 10*3/uL (ref 0.0–0.1)
Lymphocytes Relative: 27 % (ref 12–46)
Lymphs Abs: 3.5 10*3/uL (ref 0.7–4.0)
Neutro Abs: 7.9 10*3/uL — ABNORMAL HIGH (ref 1.7–7.7)

## 2011-06-14 LAB — CK TOTAL AND CKMB (NOT AT ARMC): Relative Index: 0.9 (ref 0.0–2.5)

## 2011-06-14 LAB — CARDIAC PANEL(CRET KIN+CKTOT+MB+TROPI)
CK, MB: 2.3 ng/mL (ref 0.3–4.0)
Total CK: 241 U/L — ABNORMAL HIGH (ref 7–232)
Troponin I: <0.3 ng/mL (ref <0.30)

## 2011-06-14 MED ORDER — HYDROCODONE-ACETAMINOPHEN 5-325 MG PO TABS
1.0000 | ORAL_TABLET | Freq: Once | ORAL | Status: AC
  Administered 2011-06-14: 1 via ORAL
  Filled 2011-06-14: qty 1

## 2011-06-14 MED ORDER — HYDROCODONE-ACETAMINOPHEN 5-500 MG PO TABS: 1.0000 | ORAL_TABLET | Freq: Four times a day (QID) | ORAL | Status: DC | PRN
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
## 2011-06-14 NOTE — Discharge Instructions (Signed)
Please take medications as prescribed. Please discuss with your nephrologist if you're concerned about the staffing at your dialysis center. Return emergency room for worsening condition or new concerning symptoms  Chest Wall Pain Chest wall pain is pain in or around the bones and muscles of your chest. It may take up to 6 weeks to get better. It may take longer if you must stay physically active in your work and activities.  CAUSES  Chest wall pain may happen on its own. However, it may be caused by:  A viral illness like the flu.   Injury.   Coughing.   Exercise.   Arthritis.   Fibromyalgia.   Shingles.  HOME CARE INSTRUCTIONS   Avoid overtiring physical activity. Try not to strain or perform activities that cause pain. This includes any activities using your chest or your abdominal and side muscles, especially if heavy weights are used.   Put ice on the sore area.   Put ice in a plastic bag.   Place a towel between your skin and the bag.   Leave the ice on for 15 to 20 minutes per hour while awake for the first 2 days.   Only take over-the-counter or prescription medicines for pain, discomfort, or fever as directed by your caregiver.  SEEK IMMEDIATE MEDICAL CARE IF:   Your pain increases, or you are very uncomfortable.   You have a fever.   Your chest pain becomes worse.   You have new, unexplained symptoms.   You have nausea or vomiting.   You feel sweaty or lightheaded.   You have a cough with phlegm (sputum), or you cough up blood.  MAKE SURE YOU:   Understand these instructions.   Will watch your condition.   Will get help right away if you are not doing well or get worse.  Document Released: 02/03/2005 Document Revised: 01/23/2011 Document Reviewed: 09/30/2010 [facility] Patient Information 2012 [place], [place].

## 2011-06-14 NOTE — ED Notes (Signed)
The pt has had lt sided chest pain for 15 minutes prior to arrival.  Alert oriented skin warm and dry no distress.  Dialysis pt last dialyzed earlier today.  Graft lt forearm

## 2011-06-16 ENCOUNTER — Emergency Department (HOSPITAL_COMMUNITY): Payer: 59

## 2011-06-16 ENCOUNTER — Encounter (HOSPITAL_COMMUNITY): Payer: Self-pay

## 2011-06-16 ENCOUNTER — Observation Stay (HOSPITAL_COMMUNITY)
Admission: EM | Admit: 2011-06-16 | Discharge: 2011-06-21 | Disposition: A | Discharge status: Home / Self Care | Payer: 59 | Attending: Internal Medicine | Admitting: Internal Medicine

## 2011-06-16 DIAGNOSIS — R1013 Epigastric pain: Principal | ICD-10-CM | POA: Insufficient documentation

## 2011-06-16 DIAGNOSIS — M25519 Pain in unspecified shoulder: Secondary | ICD-10-CM | POA: Insufficient documentation

## 2011-06-16 DIAGNOSIS — R109 Unspecified abdominal pain: Secondary | ICD-10-CM | POA: Diagnosis present

## 2011-06-16 DIAGNOSIS — N186 End stage renal disease: Secondary | ICD-10-CM | POA: Insufficient documentation

## 2011-06-16 DIAGNOSIS — F3112 Bipolar disorder, current episode manic without psychotic features, moderate: Secondary | ICD-10-CM | POA: Diagnosis present

## 2011-06-16 DIAGNOSIS — I2510 Atherosclerotic heart disease of native coronary artery without angina pectoris: Secondary | ICD-10-CM | POA: Insufficient documentation

## 2011-06-16 DIAGNOSIS — F319 Bipolar disorder, unspecified: Secondary | ICD-10-CM | POA: Insufficient documentation

## 2011-06-16 DIAGNOSIS — I10 Essential (primary) hypertension: Secondary | ICD-10-CM | POA: Diagnosis present

## 2011-06-16 DIAGNOSIS — E119 Type 2 diabetes mellitus without complications: Secondary | ICD-10-CM | POA: Insufficient documentation

## 2011-06-16 DIAGNOSIS — R0602 Shortness of breath: Secondary | ICD-10-CM | POA: Insufficient documentation

## 2011-06-16 DIAGNOSIS — I120 Hypertensive chronic kidney disease with stage 5 chronic kidney disease or end stage renal disease: Secondary | ICD-10-CM | POA: Insufficient documentation

## 2011-06-16 DIAGNOSIS — Z992 Dependence on renal dialysis: Secondary | ICD-10-CM | POA: Insufficient documentation

## 2011-06-16 DIAGNOSIS — R079 Chest pain, unspecified: Secondary | ICD-10-CM | POA: Insufficient documentation

## 2011-06-16 DIAGNOSIS — Z8673 Personal history of transient ischemic attack (TIA), and cerebral infarction without residual deficits: Secondary | ICD-10-CM | POA: Insufficient documentation

## 2011-06-16 HISTORY — DX: Sickle-cell disease without crisis: D57.1

## 2011-06-16 LAB — URINALYSIS, ROUTINE W REFLEX MICROSCOPIC
Bilirubin Urine: NEGATIVE
Ketones, ur: NEGATIVE mg/dL
Nitrite: NEGATIVE
Protein, ur: >300 mg/dL — AB
pH: 8 (ref 5.0–8.0)

## 2011-06-16 LAB — CBC
MCH: 30.3 pg (ref 26.0–34.0)
MCHC: 34 g/dL (ref 30.0–36.0)
MCV: 89.2 fL (ref 78.0–100.0)
Platelets: 342 10*3/uL (ref 150–400)
RBC: 3.23 MIL/uL — ABNORMAL LOW (ref 4.22–5.81)
RDW: 14.2 % (ref 11.5–15.5)

## 2011-06-16 LAB — POCT I-STAT, CHEM 8
BUN: 43 mg/dL — ABNORMAL HIGH (ref 6–23)
Calcium, Ion: 1.15 mmol/L (ref 1.12–1.32)
Chloride: 104 mEq/L (ref 96–112)
Creatinine, Ser: 11.4 mg/dL — ABNORMAL HIGH (ref 0.50–1.35)

## 2011-06-16 LAB — TYPE AND SCREEN

## 2011-06-16 LAB — URINE MICROSCOPIC-ADD ON

## 2011-06-16 MED ORDER — IOHEXOL 300 MG/ML  SOLN
100.0000 mL | Freq: Once | INTRAMUSCULAR | Status: AC | PRN
  Administered 2011-06-16: 100 mL via INTRAVENOUS

## 2011-06-16 MED ORDER — MORPHINE SULFATE 4 MG/ML IJ SOLN
4.0000 mg | Freq: Once | INTRAMUSCULAR | Status: AC
  Administered 2011-06-16: 4 mg via INTRAVENOUS
  Filled 2011-06-16: qty 1
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
## 2011-06-16 NOTE — ED Notes (Signed)
Pt ao x 4.  States abd pain and chest pain continue to be 9/10. VS stable.

## 2011-06-17 ENCOUNTER — Encounter (HOSPITAL_COMMUNITY): Payer: Self-pay | Admitting: Internal Medicine

## 2011-06-17 ENCOUNTER — Observation Stay (HOSPITAL_COMMUNITY): Payer: 59

## 2011-06-17 DIAGNOSIS — N186 End stage renal disease: Secondary | ICD-10-CM | POA: Diagnosis present

## 2011-06-17 DIAGNOSIS — R109 Unspecified abdominal pain: Secondary | ICD-10-CM | POA: Diagnosis present

## 2011-06-17 LAB — CARDIAC PANEL(CRET KIN+CKTOT+MB+TROPI)
CK, MB: 3.8 ng/mL (ref 0.3–4.0)
Relative Index: 1 (ref 0.0–2.5)
Troponin I: <0.3 ng/mL (ref <0.30)

## 2011-06-17 LAB — CBC
HCT: 30.5 % — ABNORMAL LOW (ref 39.0–52.0)
MCH: 30.8 pg (ref 26.0–34.0)
MCV: 90.2 fL (ref 78.0–100.0)
RDW: 14.2 % (ref 11.5–15.5)
WBC: 11.9 10*3/uL — ABNORMAL HIGH (ref 4.0–10.5)

## 2011-06-17 LAB — COMPREHENSIVE METABOLIC PANEL
ALT: 19 U/L (ref 0–53)
AST: 18 U/L (ref 0–37)
Albumin: 3.7 g/dL (ref 3.5–5.2)
Alkaline Phosphatase: 100 U/L (ref 39–117)
BUN: 43 mg/dL — ABNORMAL HIGH (ref 6–23)
Chloride: 95 mEq/L — ABNORMAL LOW (ref 96–112)
Potassium: 4.8 mEq/L (ref 3.5–5.1)
Sodium: 133 mEq/L — ABNORMAL LOW (ref 135–145)
Total Protein: 7.2 g/dL (ref 6.0–8.3)

## 2011-06-17 MED ORDER — HYDRALAZINE HCL 20 MG/ML IJ SOLN
10.0000 mg | INTRAMUSCULAR | Status: DC | PRN
  Administered 2011-06-18 (×2): 10 mg via INTRAVENOUS
  Filled 2011-06-17 (×2): qty 0.5

## 2011-06-17 MED ORDER — CLONIDINE HCL 0.2 MG PO TABS
0.2000 mg | ORAL_TABLET | Freq: Three times a day (TID) | ORAL | Status: DC
  Administered 2011-06-17 – 2011-06-21 (×11): 0.2 mg via ORAL
  Filled 2011-06-17 (×15): qty 1

## 2011-06-17 MED ORDER — HYDROCODONE-ACETAMINOPHEN 5-325 MG PO TABS
1.0000 | ORAL_TABLET | Freq: Four times a day (QID) | ORAL | Status: DC | PRN
  Administered 2011-06-17: 1 via ORAL
  Administered 2011-06-18 – 2011-06-20 (×2): 2 via ORAL
  Filled 2011-06-17 (×2): qty 2
  Filled 2011-06-17: qty 1

## 2011-06-17 MED ORDER — ONDANSETRON HCL 4 MG PO TABS: 4.0000 mg | ORAL_TABLET | Freq: Four times a day (QID) | ORAL | Status: DC | PRN

## 2011-06-17 MED ORDER — CLONIDINE HCL 0.2 MG PO TABS
0.2000 mg | ORAL_TABLET | Freq: Two times a day (BID) | ORAL | Status: DC
Start: 2011-06-17 — End: 2011-06-17
  Administered 2011-06-17: 0.2 mg via ORAL
  Filled 2011-06-17 (×3): qty 1

## 2011-06-17 MED ORDER — ACETAMINOPHEN 325 MG PO TABS
650.0000 mg | ORAL_TABLET | Freq: Four times a day (QID) | ORAL | Status: DC | PRN
  Administered 2011-06-17: 325 mg via ORAL

## 2011-06-17 MED ORDER — HYDROMORPHONE HCL PF 1 MG/ML IJ SOLN: 0.5000 mg | Freq: Four times a day (QID) | INTRAMUSCULAR | Status: DC | PRN

## 2011-06-17 MED ORDER — HYDROXYZINE HCL 25 MG PO TABS
25.0000 mg | ORAL_TABLET | Freq: Three times a day (TID) | ORAL | Status: DC | PRN
  Administered 2011-06-18 – 2011-06-20 (×2): 25 mg via ORAL
  Filled 2011-06-17 (×2): qty 1

## 2011-06-17 MED ORDER — AMLODIPINE BESYLATE 10 MG PO TABS
10.0000 mg | ORAL_TABLET | Freq: Every day | ORAL | Status: DC
  Administered 2011-06-17 – 2011-06-21 (×5): 10 mg via ORAL
  Filled 2011-06-17 (×5): qty 1

## 2011-06-17 MED ORDER — HYDROMORPHONE HCL PF 1 MG/ML IJ SOLN
1.0000 mg | INTRAMUSCULAR | Status: DC | PRN
  Administered 2011-06-17: 1 mg via INTRAVENOUS
  Filled 2011-06-17: qty 1

## 2011-06-17 MED ORDER — ONDANSETRON HCL 4 MG/2ML IJ SOLN
4.0000 mg | Freq: Four times a day (QID) | INTRAMUSCULAR | Status: DC | PRN
  Administered 2011-06-17 – 2011-06-18 (×2): 4 mg via INTRAVENOUS
  Filled 2011-06-17 (×2): qty 2

## 2011-06-17 MED ORDER — ACETAMINOPHEN 650 MG RE SUPP: 650.0000 mg | Freq: Four times a day (QID) | RECTAL | Status: DC | PRN

## 2011-06-17 MED ORDER — ACETAMINOPHEN 325 MG PO TABS
ORAL_TABLET | ORAL | Status: AC
  Filled 2011-06-17: qty 2

## 2011-06-17 MED ORDER — OXYCODONE HCL 5 MG PO TABS
10.0000 mg | ORAL_TABLET | Freq: Four times a day (QID) | ORAL | Status: DC
  Administered 2011-06-17 – 2011-06-21 (×10): 10 mg via ORAL
  Filled 2011-06-17: qty 1
  Filled 2011-06-17 (×13): qty 2
  Filled 2011-06-17 (×2): qty 1

## 2011-06-17 MED ORDER — LISINOPRIL 20 MG PO TABS
20.0000 mg | ORAL_TABLET | Freq: Every day | ORAL | Status: DC
  Administered 2011-06-17 – 2011-06-21 (×5): 20 mg via ORAL
  Filled 2011-06-17 (×5): qty 1

## 2011-06-17 MED ORDER — SODIUM CHLORIDE 0.9 % IJ SOLN
3.0000 mL | Freq: Two times a day (BID) | INTRAMUSCULAR | Status: DC
  Administered 2011-06-18 – 2011-06-20 (×4): 3 mL via INTRAVENOUS

## 2011-06-17 MED ORDER — ACETAMINOPHEN 325 MG PO TABS
ORAL_TABLET | ORAL | Status: AC
  Filled 2011-06-17: qty 1

## 2011-06-17 MED ORDER — SODIUM CHLORIDE 0.9 % IJ SOLN
3.0000 mL | Freq: Two times a day (BID) | INTRAMUSCULAR | Status: DC
  Administered 2011-06-17 – 2011-06-21 (×8): 3 mL via INTRAVENOUS

## 2011-06-17 MED ORDER — HEPARIN SODIUM (PORCINE) 1000 UNIT/ML DIALYSIS: 20.0000 [IU]/kg | INTRAMUSCULAR | Status: DC | PRN
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
## 2011-06-17 NOTE — Progress Notes (Signed)
Walked with PT. Some epigastric pain. No N/V On my exam some epig tend but no generalized tend, no peritonitis Patient examined and I agree with the assessment and plan  Violeta Gelinas, MD, MPH, FACS Pager: [phone]  06/17/2011 10:51 AM
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
## 2011-06-17 NOTE — Progress Notes (Signed)
OT Cancellation Note  Treatment cancelled today due to patient receiving procedure or test--pt is currently in dialysis. Will try to evaluate tomorrow.  Gabriel Bell [phone] 06/17/2011, 2:18 PM

## 2011-06-17 NOTE — Consult Note (Signed)
Pt. Admitted  With abdominal pain .chest  Xray  Had some  Increase in fluid. Patient  Missed  His dialysis.see orders.
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
## 2011-06-18 ENCOUNTER — Observation Stay (HOSPITAL_COMMUNITY): Payer: 59

## 2011-06-18 DIAGNOSIS — R1013 Epigastric pain: Secondary | ICD-10-CM

## 2011-06-18 DIAGNOSIS — N186 End stage renal disease: Secondary | ICD-10-CM

## 2011-06-18 DIAGNOSIS — I10 Essential (primary) hypertension: Secondary | ICD-10-CM

## 2011-06-18 LAB — RENAL FUNCTION PANEL
CO2: 26 mEq/L (ref 19–32)
CO2: 25 mEq/L (ref 19–32)
Calcium: 9.7 mg/dL (ref 8.4–10.5)
Chloride: 93 mEq/L — ABNORMAL LOW (ref 96–112)
Creatinine, Ser: 7.44 mg/dL — ABNORMAL HIGH (ref 0.50–1.35)
Creatinine, Ser: 7.78 mg/dL — ABNORMAL HIGH (ref 0.50–1.35)
GFR calc Af Amer: 9 mL/min — ABNORMAL LOW (ref >90)
GFR calc Af Amer: 8 mL/min — ABNORMAL LOW (ref >90)
GFR calc non Af Amer: 7 mL/min — ABNORMAL LOW (ref >90)
Glucose, Bld: 115 mg/dL — ABNORMAL HIGH (ref 70–99)

## 2011-06-18 LAB — CBC
Hemoglobin: 9.9 g/dL — ABNORMAL LOW (ref 13.0–17.0)
MCH: 30.5 pg (ref 26.0–34.0)
MCV: 90.2 fL (ref 78.0–100.0)
MCV: 89.5 fL (ref 78.0–100.0)
Platelets: 283 10*3/uL (ref 150–400)
RBC: 3.25 MIL/uL — ABNORMAL LOW (ref 4.22–5.81)
RBC: 3.72 MIL/uL — ABNORMAL LOW (ref 4.22–5.81)
WBC: 7.9 10*3/uL (ref 4.0–10.5)

## 2011-06-18 MED ORDER — HEPARIN SODIUM (PORCINE) 1000 UNIT/ML DIALYSIS: 20.0000 [IU]/kg | INTRAMUSCULAR | Status: DC | PRN

## 2011-06-18 MED ORDER — IOHEXOL 300 MG/ML  SOLN
100.0000 mL | Freq: Once | INTRAMUSCULAR | Status: AC | PRN
  Administered 2011-06-18: 100 mL via INTRAVENOUS
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
## 2011-06-18 NOTE — Progress Notes (Signed)
Physical Therapy Note:   06/18/11 1432  PT Visit Information  Reason Eval/Treat Not Completed Patient not in room (in hemodialysis, held session this am secondary to nausea)  PT - Assessment/Plan  Comments on Treatment Session Will follow-up for PT session tomorrow; It is likely that pt will meet PT goals next session, and we may dc acute PT services with HHPT follow-up    Van Clines, PT [phone]
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
## 2011-06-18 NOTE — Consult Note (Signed)
+  pt  Is  Bipolar. Needs  Psych. Consult.  Dialysis  Orders written.

## 2011-06-18 NOTE — Progress Notes (Signed)
06/18/2011 Fransico Michael SPARKS Case Management Note [phone]  HOME HEALTH AGENCIES SERVING [facility]   Agencies that are Medicare-Certified and are affiliated with [facility]  Telephone Number Address  [facility]   The [facility] has ownership interest in this company; however, you are under no obligation to use this agency. [phone] or  [phone] [address] [place], [place] [address]   Agencies that are Medicare-Certified and are not affiliated with [facility] Agency Telephone Number Address  [facility] [phone] Fax [phone] [address] [place], [place]  [address]  [facility] [phone] or [phone] Fax [phone] [address] [place], [place] [address]  [facility] [phone] Fax [phone] [address] [place], [place] [address]  [facility] [phone] Fax [phone] [address] [place], [place]  [address]  [facility] [phone] Fax [phone] [address], Suite [place], [place] [address]  [facility] [phone] [address] [place], [place] [address]  [facility] [phone]  [address] [place], [place] [address]  [facility] [phone] or [phone] Fax [phone] [address] [place], [place]  [address]  [facility] [phone] Fax [phone] [address] [place], [place]  [address]  [facility]  [phone] Fax [phone] [address] [place], [place] [address]               Agencies that are not Medicare-Certified and are not affiliated with [facility] Agency Telephone Number Address  [facility] [place] [phone] or [phone] Fax [phone] [address] [place]  [address]  [facility] [phone] Fax [phone] [address] [place], [place]  [address]  [facility]  [phone] Fax [phone] [address] [place], [place] [address]  [facility] [phone] Fax [phone] [address] [place], [place] [address]  [facility] [phone] or [phone] Fax [phone] [address] [place], [place]  [address]  [facility] [phone] or [phone] Fax [phone] [address], [address] [place], [place]  [address]  [facility] [phone] Fax [phone] [address] [place], [place]  [address]  [facility] [phone] [address] [place], [place]  [address]  [facility] [phone] Fax [phone] [address] [place], [place]  [address]  [facility] [phone] Fax [phone] [address] [place], [place]  [address]  [facility] [phone] Fax [phone] [address] [place], [place] [address]  [facility] [phone] Fax [phone] [address] [place], [place]  [address]   In to offer choice for home health agencies. Patient refused  services. MD will be notified.
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
## 2011-06-19 DIAGNOSIS — F319 Bipolar disorder, unspecified: Secondary | ICD-10-CM

## 2011-06-19 DIAGNOSIS — I10 Essential (primary) hypertension: Secondary | ICD-10-CM

## 2011-06-19 DIAGNOSIS — N186 End stage renal disease: Secondary | ICD-10-CM

## 2011-06-19 DIAGNOSIS — R1013 Epigastric pain: Secondary | ICD-10-CM

## 2011-06-19 DIAGNOSIS — F3112 Bipolar disorder, current episode manic without psychotic features, moderate: Secondary | ICD-10-CM | POA: Diagnosis present

## 2011-06-19 LAB — HEPATITIS B SURFACE ANTIGEN: Hepatitis B Surface Ag: NEGATIVE

## 2011-06-19 LAB — CBC
MCV: 91.5 fL (ref 78.0–100.0)
Platelets: 273 10*3/uL (ref 150–400)
RBC: 2.93 MIL/uL — ABNORMAL LOW (ref 4.22–5.81)
WBC: 8.3 10*3/uL (ref 4.0–10.5)

## 2011-06-19 LAB — BASIC METABOLIC PANEL
CO2: 30 mEq/L (ref 19–32)
Chloride: 99 mEq/L (ref 96–112)
Potassium: 4 mEq/L (ref 3.5–5.1)
Sodium: 137 mEq/L (ref 135–145)

## 2011-06-19 MED ORDER — LORAZEPAM 1 MG PO TABS
1.0000 mg | ORAL_TABLET | ORAL | Status: DC
  Filled 2011-06-19: qty 1

## 2011-06-19 MED ORDER — QUETIAPINE FUMARATE 200 MG PO TABS
200.0000 mg | ORAL_TABLET | Freq: Every day | ORAL | Status: DC
  Administered 2011-06-19 – 2011-06-21 (×3): 200 mg via ORAL
  Filled 2011-06-19 (×3): qty 1
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
## 2011-06-19 NOTE — Progress Notes (Signed)
Physical Therapy Treatment Patient Details Name: Gabriel Bell MRN: [id number] DOB: 17-Feb-1961 Today's Date: 06/19/2011 Time: 5784-6962 PT Time Calculation (min): 12 min  PT Assessment / Plan / Recommendation Comments on Treatment Session  Pt. has some lingering mild gait instability that PT will continue to address next visit.    Follow Up Recommendations  Home health PT;Outpatient PT    Equipment Recommendations  None recommended by PT    Frequency Min 3X/week   Plan Discharge plan remains appropriate    Precautions / Restrictions Precautions Precautions: Fall Restrictions Weight Bearing Restrictions: No       Mobility  Bed Mobility Bed Mobility: Not assessed Transfers Transfers: Sit to Stand;Stand to Sit Sit to Stand: 4: Min guard;From chair/3-in-1 Stand to Sit: 4: Min guard;To chair/3-in-1 Details for Transfer Assistance: safety and control cues Ambulation/Gait Ambulation/Gait Assistance: 4: Min guard Ambulation Distance (Feet): 140 Feet Assistive device: None Ambulation/Gait Assistance Details: Increased lateral shift to allow forward propulsion bilaterally.  Slight instability when walking. Gait Pattern: Lateral trunk lean to right;Lateral trunk lean to left    Exercises     PT Goals Acute Rehab PT Goals PT Goal Formulation: With patient PT Goal: Sit to Stand - Progress: Progressing toward goal PT Goal: Stand to Sit - Progress: Progressing toward goal PT Goal: Ambulate - Progress: Progressing toward goal  Visit Information  Last PT Received On: 06/19/11 Assistance Needed: +1    Subjective Data  Subjective: epigastric pain 4/10   Cognition  Overall Cognitive Status: Appears within functional limits for tasks assessed/performed Arousal/Alertness: Awake/alert Orientation Level: Appears intact for tasks assessed Behavior During Session: [facility] for tasks performed Cognition - Other Comments: able to maintain focus today, less distractable    Balance       End of Session PT - End of Session Equipment Utilized During Treatment: Gait belt Activity Tolerance: Patient tolerated treatment well Patient left: in chair;with call bell/phone within reach    Ferman Hamming 06/19/2011, 10:40 AM Acute Rehabilitation Services [phone] (pager)
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
## 2011-06-19 NOTE — Progress Notes (Signed)
Patient calm at present, wants to speak with Md about Psych Md coming to see him, states he is not crazy. Patient also states he will not take any new medications until Md comes to speak with him. Md paged.
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
## 2011-06-19 NOTE — Progress Notes (Signed)
Repeat CT negative for acute problems  This patient has been seen and I agree with the findings and treatment plan.  Marta Lamas. Gae Bon, MD, FACS [phone] (pager) [phone] (direct pager) Trauma Surgeon

## 2011-06-20 ENCOUNTER — Observation Stay (HOSPITAL_COMMUNITY): Payer: 59

## 2011-06-20 DIAGNOSIS — R1013 Epigastric pain: Secondary | ICD-10-CM

## 2011-06-20 DIAGNOSIS — I10 Essential (primary) hypertension: Secondary | ICD-10-CM

## 2011-06-20 DIAGNOSIS — N186 End stage renal disease: Secondary | ICD-10-CM

## 2011-06-20 LAB — BASIC METABOLIC PANEL
CO2: 27 mEq/L (ref 19–32)
Chloride: 95 mEq/L — ABNORMAL LOW (ref 96–112)
Creatinine, Ser: 7.59 mg/dL — ABNORMAL HIGH (ref 0.50–1.35)
GFR calc Af Amer: 9 mL/min — ABNORMAL LOW (ref >90)
Potassium: 4 mEq/L (ref 3.5–5.1)
Sodium: 133 mEq/L — ABNORMAL LOW (ref 135–145)

## 2011-06-20 LAB — CBC
MCV: 91.1 fL (ref 78.0–100.0)
Platelets: 294 10*3/uL (ref 150–400)
RBC: 3.05 MIL/uL — ABNORMAL LOW (ref 4.22–5.81)
RDW: 14.3 % (ref 11.5–15.5)
WBC: 8.2 10*3/uL (ref 4.0–10.5)

## 2011-06-20 MED ORDER — HEPARIN SODIUM (PORCINE) 1000 UNIT/ML DIALYSIS
20.0000 [IU]/kg | INTRAMUSCULAR | Status: DC | PRN
  Filled 2011-06-20: qty 3

## 2011-06-20 MED ORDER — CAMPHOR-MENTHOL 0.5-0.5 % EX LOTN
TOPICAL_LOTION | CUTANEOUS | Status: DC | PRN
  Filled 2011-06-20: qty 222

## 2011-06-20 NOTE — Consult Note (Signed)
Pt. Seems calm now.  Orders written.

## 2011-06-20 NOTE — BH Assessment (Addendum)
Pt admitted for pain in abdomen. He has been very agitated and dissatisfied with staff.  He has agreed to take medication and tells Dr. Suanne Marker his abdominal  pain is less.  CONSULTATION Progress Note for agitation, irritability AXIS I Bipolar I, manic moderate, some grandiosity  AXIS II Deferred  AXIS III  Past Medical History   Diagnosis  Date   .  Diabetes mellitus    .  ESRD on dialysis    .  Hypertension    .  Stroke      States he had 3 strokes at once   .  Coronary artery disease      Coronary calcification seen on chest CT. Pt denies MI's   .  Sickle cell anemia     History reviewed. No pertinent past surgical history.  AXIS IV Destructive behaviors interfering with needed medical care, legal issues serious medical issues  AXIS V GAF 40   ASSESSMENT PLAN Assessment/Plan: Chart reviewed, discussed with Dr. Suanne Marker and Psych CSW  Mental Status Evaluation:  Appearance:  overweight   Behavior:  psychomotor agitation and aggression - much calmer  Speech:  Soft barely audible s/p Ativan  Mood:  Calmer, expresses appreciation  Aflfect:  Mood congruent  Thought Process:  normal   Thought Content:  Partially sedated, not expression subjective thought  Sensorium:  person, place and situation   Cognition:  grossly intact   Insight:  poor   Judgment:  better  Pt had a   Talk with Dr. Burnell Blanks and Head nurse.  He is to be referred to a nephrologist and after discussion of medication, purpose, benefits SE and alternative/consequences.  Pt agreed to tak medication.  A day will demonstrate tolerability and efficacy.  Will monitor if dose change is needed.  Ativan before hemodialysis apparently has resolved the agitation and premature ending of the dialysis period.    RECOMMENDATION 1. Continue Seroquel and Ativan as order; no reports or observation of EPS today.  2.Will follow pt.   Mickeal Skinner MD 06/20/11  3:47 PM
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
## 2011-06-21 DIAGNOSIS — R1013 Epigastric pain: Secondary | ICD-10-CM

## 2011-06-21 DIAGNOSIS — N186 End stage renal disease: Secondary | ICD-10-CM

## 2011-06-21 DIAGNOSIS — I10 Essential (primary) hypertension: Secondary | ICD-10-CM

## 2011-06-21 MED ORDER — LORAZEPAM 1 MG PO TABS: 1.0000 mg | ORAL_TABLET | ORAL | Status: DC

## 2011-06-21 MED ORDER — QUETIAPINE FUMARATE 200 MG PO TABS: 200.0000 mg | ORAL_TABLET | Freq: Every day | ORAL | Status: DC

## 2011-06-21 MED ORDER — CLONIDINE HCL 0.1 MG PO TABS: 0.2000 mg | ORAL_TABLET | Freq: Three times a day (TID) | ORAL | Status: DC
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
## 2011-06-23 ENCOUNTER — Other Ambulatory Visit (HOSPITAL_COMMUNITY): Payer: Self-pay

## 2011-06-24 ENCOUNTER — Encounter (HOSPITAL_COMMUNITY): Payer: Self-pay | Admitting: Emergency Medicine

## 2011-06-24 ENCOUNTER — Emergency Department (HOSPITAL_COMMUNITY)
Admission: EM | Admit: 2011-06-24 | Discharge: 2011-06-25 | Disposition: A | Discharge status: Home / Self Care | Payer: 59 | Attending: Emergency Medicine | Admitting: Emergency Medicine

## 2011-06-24 DIAGNOSIS — Z7982 Long term (current) use of aspirin: Secondary | ICD-10-CM | POA: Insufficient documentation

## 2011-06-24 DIAGNOSIS — I2510 Atherosclerotic heart disease of native coronary artery without angina pectoris: Secondary | ICD-10-CM | POA: Insufficient documentation

## 2011-06-24 DIAGNOSIS — Z950 Presence of cardiac pacemaker: Secondary | ICD-10-CM | POA: Insufficient documentation

## 2011-06-24 DIAGNOSIS — D571 Sickle-cell disease without crisis: Secondary | ICD-10-CM | POA: Insufficient documentation

## 2011-06-24 DIAGNOSIS — R059 Cough, unspecified: Secondary | ICD-10-CM | POA: Insufficient documentation

## 2011-06-24 DIAGNOSIS — J069 Acute upper respiratory infection, unspecified: Secondary | ICD-10-CM | POA: Insufficient documentation

## 2011-06-24 DIAGNOSIS — R070 Pain in throat: Secondary | ICD-10-CM | POA: Insufficient documentation

## 2011-06-24 DIAGNOSIS — R42 Dizziness and giddiness: Secondary | ICD-10-CM | POA: Insufficient documentation

## 2011-06-24 DIAGNOSIS — E119 Type 2 diabetes mellitus without complications: Secondary | ICD-10-CM | POA: Insufficient documentation

## 2011-06-24 DIAGNOSIS — Z79899 Other long term (current) drug therapy: Secondary | ICD-10-CM | POA: Insufficient documentation

## 2011-06-24 DIAGNOSIS — R05 Cough: Secondary | ICD-10-CM | POA: Insufficient documentation

## 2011-06-24 DIAGNOSIS — N186 End stage renal disease: Secondary | ICD-10-CM | POA: Insufficient documentation

## 2011-06-24 DIAGNOSIS — Z8673 Personal history of transient ischemic attack (TIA), and cerebral infarction without residual deficits: Secondary | ICD-10-CM | POA: Insufficient documentation

## 2011-06-24 DIAGNOSIS — I120 Hypertensive chronic kidney disease with stage 5 chronic kidney disease or end stage renal disease: Secondary | ICD-10-CM | POA: Insufficient documentation

## 2011-06-24 NOTE — ED Notes (Signed)
Patient with sore throat and dizziness.  Patient states that it started on Sunday.  Patient states he has been coughing a little bit of blood.  He feels like his tonsils are swollen.  Patient states he has been taking Vicodin with no relief.

## 2011-06-25 ENCOUNTER — Emergency Department (HOSPITAL_COMMUNITY): Payer: 59

## 2011-06-25 LAB — POCT I-STAT, CHEM 8
BUN: 49 mg/dL — ABNORMAL HIGH (ref 6–23)
Chloride: 103 mEq/L (ref 96–112)
Glucose, Bld: 115 mg/dL — ABNORMAL HIGH (ref 70–99)
HCT: 30 % — ABNORMAL LOW (ref 39.0–52.0)
Potassium: 5 mEq/L (ref 3.5–5.1)

## 2011-06-25 LAB — CBC
HCT: 28.7 % — ABNORMAL LOW (ref 39.0–52.0)
MCV: 90 fL (ref 78.0–100.0)
Platelets: 367 10*3/uL (ref 150–400)
RBC: 3.19 MIL/uL — ABNORMAL LOW (ref 4.22–5.81)
RDW: 14 % (ref 11.5–15.5)
WBC: 9.6 10*3/uL (ref 4.0–10.5)

## 2011-06-25 MED ORDER — PHENOL 1.4 % MT LIQD
1.0000 | OROMUCOSAL | Status: DC | PRN
  Administered 2011-06-25: 1 via OROMUCOSAL
  Filled 2011-06-25: qty 177

## 2011-06-25 NOTE — ED Notes (Signed)
Patient transported to X-ray 

## 2011-06-25 NOTE — Discharge Instructions (Signed)
It is very important that you get your scheduled dialysis in the morning as discussed.  Followup with Dr. Bascom Levels tomorrow.  Upper Respiratory Infection, Adult  An upper respiratory infection (URI) is also sometimes known as the common cold. The upper respiratory tract includes the nose, sinuses, throat, trachea, and bronchi. Bronchi are the airways leading to the lungs. Most people improve within 1 week, but symptoms can last up to 2 weeks. A residual cough may last even longer.  CAUSES  Many different viruses can infect the tissues lining the upper respiratory tract. The tissues become irritated and inflamed and often become very moist. Mucus production is also common. A cold is contagious. You can easily spread the virus to others by oral contact. This includes kissing, sharing a glass, coughing, or sneezing. Touching your mouth or nose and then touching a surface, which is then touched by another person, can also spread the virus.  SYMPTOMS  Symptoms typically develop 1 to 3 days after you come in contact with a cold virus. Symptoms vary from person to person. They may include:  Runny nose.  Sneezing.  Nasal congestion.  Sinus irritation.  Sore throat.  Loss of voice (laryngitis).  Cough.  Fatigue.  Muscle aches.  Loss of appetite.  Headache.  Low-grade fever.  DIAGNOSIS  You might diagnose your own cold based on familiar symptoms, since most people get a cold 2 to 3 times a year. Your caregiver can confirm this based on your exam. Most importantly, your caregiver can check that your symptoms are not due to another disease such as strep throat, sinusitis, pneumonia, asthma, or epiglottitis. Blood tests, throat tests, and X-rays are not necessary to diagnose a common cold, but they may sometimes be helpful in excluding other more serious diseases. Your caregiver will decide if any further tests are required.  RISKS AND COMPLICATIONS  You may be at risk for a more severe case of the common  cold if you smoke cigarettes, have chronic heart disease (such as heart failure) or lung disease (such as asthma), or if you have a weakened immune system. The very young and very old are also at risk for more serious infections. Bacterial sinusitis, middle ear infections, and bacterial pneumonia can complicate the common cold. The common cold can worsen asthma and chronic obstructive pulmonary disease (COPD). Sometimes, these complications can require emergency medical care and may be life-threatening.  PREVENTION  The best way to protect against getting a cold is to practice good hygiene. Avoid oral or hand contact with people with cold symptoms. Wash your hands often if contact occurs. There is no clear evidence that vitamin C, vitamin E, echinacea, or exercise reduces the chance of developing a cold. However, it is always recommended to get plenty of rest and practice good nutrition.  TREATMENT  Treatment is directed at relieving symptoms. There is no cure. Antibiotics are not effective, because the infection is caused by a virus, not by bacteria. Treatment may include:  Increased fluid intake. Sports drinks offer valuable electrolytes, sugars, and fluids.  Breathing heated mist or steam (vaporizer or shower).  Eating chicken soup or other clear broths, and maintaining good nutrition.  Getting plenty of rest.  Using gargles or lozenges for comfort.  Controlling fevers with ibuprofen or acetaminophen as directed by your caregiver.  Increasing usage of your inhaler if you have asthma.  Zinc gel and zinc lozenges, taken in the first 24 hours of the common cold, can shorten the duration and lessen  the severity of symptoms. Pain medicines may help with fever, muscle aches, and throat pain. A variety of non-prescription medicines are available to treat congestion and runny nose. Your caregiver can make recommendations and may suggest nasal or lung inhalers for other symptoms.  HOME CARE INSTRUCTIONS    Only take over-the-counter or prescription medicines for pain, discomfort, or fever as directed by your caregiver.  Use a warm mist humidifier or inhale steam from a shower to increase air moisture. This may keep secretions moist and make it easier to breathe.  Drink enough water and fluids to keep your urine clear or pale yellow.  Rest as needed.  Return to work when your temperature has returned to normal or as your caregiver advises. You may need to stay home longer to avoid infecting others. You can also use a face mask and careful hand washing to prevent spread of the virus.  SEEK MEDICAL CARE IF:  After the first few days, you feel you are getting worse rather than better.  You need your caregiver's advice about medicines to control symptoms.  You develop chills, worsening shortness of breath, or brown or red sputum. These may be signs of pneumonia.  You develop yellow or brown nasal discharge or pain in the face, especially when you bend forward. These may be signs of sinusitis.  You develop a fever, swollen neck glands, pain with swallowing, or white areas in the back of your throat. These may be signs of strep throat.  SEEK IMMEDIATE MEDICAL CARE IF:  You have a fever.  You develop severe or persistent headache, ear pain, sinus pain, or chest pain.  You develop wheezing, a prolonged cough, cough up blood, or have a change in your usual mucus (if you have chronic lung disease).  You develop sore muscles or a stiff neck.
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
## 2011-07-03 ENCOUNTER — Emergency Department (HOSPITAL_COMMUNITY): Payer: 59

## 2011-07-03 ENCOUNTER — Encounter (HOSPITAL_COMMUNITY): Payer: Self-pay | Admitting: *Deleted

## 2011-07-03 ENCOUNTER — Emergency Department (HOSPITAL_COMMUNITY)
Admission: EM | Admit: 2011-07-03 | Discharge: 2011-07-03 | Disposition: A | Discharge status: Home / Self Care | Payer: 59 | Attending: Emergency Medicine | Admitting: Emergency Medicine

## 2011-07-03 DIAGNOSIS — Z8673 Personal history of transient ischemic attack (TIA), and cerebral infarction without residual deficits: Secondary | ICD-10-CM | POA: Insufficient documentation

## 2011-07-03 DIAGNOSIS — I2510 Atherosclerotic heart disease of native coronary artery without angina pectoris: Secondary | ICD-10-CM | POA: Insufficient documentation

## 2011-07-03 DIAGNOSIS — Z992 Dependence on renal dialysis: Secondary | ICD-10-CM | POA: Insufficient documentation

## 2011-07-03 DIAGNOSIS — E119 Type 2 diabetes mellitus without complications: Secondary | ICD-10-CM | POA: Insufficient documentation

## 2011-07-03 DIAGNOSIS — Z79899 Other long term (current) drug therapy: Secondary | ICD-10-CM | POA: Insufficient documentation

## 2011-07-03 DIAGNOSIS — D571 Sickle-cell disease without crisis: Secondary | ICD-10-CM | POA: Insufficient documentation

## 2011-07-03 DIAGNOSIS — R0602 Shortness of breath: Secondary | ICD-10-CM | POA: Insufficient documentation

## 2011-07-03 DIAGNOSIS — N186 End stage renal disease: Secondary | ICD-10-CM | POA: Insufficient documentation

## 2011-07-03 DIAGNOSIS — I120 Hypertensive chronic kidney disease with stage 5 chronic kidney disease or end stage renal disease: Secondary | ICD-10-CM | POA: Insufficient documentation

## 2011-07-03 LAB — POCT I-STAT, CHEM 8
BUN: 80 mg/dL — ABNORMAL HIGH (ref 6–23)
Chloride: 107 mEq/L (ref 96–112)
Creatinine, Ser: 17.7 mg/dL — ABNORMAL HIGH (ref 0.50–1.35)
Sodium: 133 mEq/L — ABNORMAL LOW (ref 135–145)

## 2011-07-03 MED ORDER — ACETAMINOPHEN 325 MG PO TABS
650.0000 mg | ORAL_TABLET | Freq: Once | ORAL | Status: AC
  Administered 2011-07-03: 650 mg via ORAL
  Filled 2011-07-03: qty 2

## 2011-07-03 MED ORDER — SODIUM POLYSTYRENE SULFONATE 15 GM/60ML PO SUSP
30.0000 g | Freq: Once | ORAL | Status: AC
  Administered 2011-07-03: 30 g via ORAL
  Filled 2011-07-03: qty 120

## 2011-07-03 NOTE — Discharge Instructions (Signed)
Shortness of Breath Shortness of breath (dyspnea) is the feeling of uneasy breathing. Shortness of breath does not always mean that there is a life-threatening illness. However, shortness of breath requires immediate medical care. CAUSES  Causes for shortness of breath include:  Not enough oxygen in the air (as with high altitudes or with a smoke-filled room).   Short-term (acute) lung disease, including:   Infections such as pneumonia.   Fluid in the lungs, such as heart failure.   A blood clot in the lungs (pulmonary embolism).   Lasting (chronic) lung diseases.   Heart disease (heart attack, angina, heart failure, and others).   Low red blood cells (anemia).   Poor physical fitness. This can cause shortness of breath when you exercise.   Chest or back injuries or stiffness.   Being overweight (obese).   Anxiety. This can make you feel like you are not getting enough air.  DIAGNOSIS  Serious medical problems can usually be found during your physical exam. Many tests may also be done to determine why you are having shortness of breath. Tests include:  Chest X-rays.   Lung function tests.   Blood tests.   Electrocardiography.   Exercise testing.   A cardiac echo.   Imaging scans.  Your caregiver may not be able to find a cause for your shortness of breath after your exam. In this case, it is important to have a follow-up exam with your caregiver as directed.  HOME CARE INSTRUCTIONS   Do not smoke. Smoking is a common cause of shortness of breath. Ask for help to stop smoking.   Avoid being around chemicals that may bother your breathing (paint fumes, dust).   Rest as needed. Slowly resume your usual activities.   If medicines were prescribed, take them as directed for the full length of time directed. This includes oxygen and any inhaled medicines.   Follow up with your caregiver as directed. Waiting to do so or failure to follow up could result in worsening of  your condition and possible disability or death.   Be sure you understand what to do or who to call if your shortness of breath worsens.  SEEK MEDICAL CARE IF:   Your condition does not improve in the time expected.   You have a hard time doing your normal activities even with rest.   You have any side effects or problems with the medicines prescribed.   You develop any new symptoms.  SEEK IMMEDIATE MEDICAL CARE IF:   Your shortness of breath is getting worse.   You feel lightheaded, faint, or develop a cough not controlled with medicines.   You start coughing up blood.   You have pain with breathing.   You have chest pain or pain in your arms, shoulders, or abdomen.   You have a fever.   You are unable to walk up stairs or exercise the way you normally do.   Your symptoms are getting worse.  Document Released: 10/29/2000 Document Revised: 01/23/2011 Document Reviewed: 06/16/2007 [employer] Patient Information 2012 [employer]. 

## 2011-07-03 NOTE — ED Notes (Signed)
Pt didn't go to dialysis Monday or Wed this week.  States he was taking too many medications and was too sleepy to go.

## 2011-07-03 NOTE — ED Provider Notes (Signed)
History     [name]: [id number]  Arrival date & time 07/03/11  0920   First MD Initiated Contact with Patient 07/03/11 [phone]      Chief Complaint  Patient presents with  . Respiratory Distress    (Consider location/radiation/quality/duration/timing/severity/associated sxs/prior treatment) [name] presenting with shortness of breath. The history is provided by the patient.  Shortness of Breath  The current episode started 3 to 5 days ago. The onset was gradual. The problem occurs continuously. The problem has been unchanged. The problem is mild. The symptoms are relieved by nothing. The symptoms are aggravated by activity. Associated symptoms include shortness of breath (mild). Pertinent negatives include no chest pain, no fever, no rhinorrhea and no cough. There was no intake of a foreign body. The Heimlich maneuver was not attempted. He was not exposed to toxic fumes. He has not inhaled smoke recently. He has had prior hospitalizations. Past medical history comments: ESRD, missed dialysis. Urine output has been normal.    Past Medical History  Diagnosis Date  . Diabetes mellitus   . ESRD on dialysis   . Hypertension   . Stroke     States he had 3 strokes at once  . Coronary artery disease     Coronary calcification seen on chest CT. Pt denies MI's   . Sickle cell anemia     No past surgical history on file.  Family History  Problem Relation Age of Onset  . Heart disease Mother   . Heart disease Father     History  Substance Use Topics  . Smoking status: Never Smoker   . Smokeless tobacco: Not on file  . Alcohol Use: No      Review of Systems  Constitutional: Negative for fever.  HENT: Negative for rhinorrhea, drooling and neck pain.   Eyes: Negative for pain.  Respiratory: Positive for shortness of breath (mild). Negative for cough.   Cardiovascular: Negative for chest pain and leg swelling.  Gastrointestinal: Negative for nausea, vomiting,  abdominal pain and diarrhea.  Genitourinary: Negative for dysuria and hematuria.  Musculoskeletal: Negative for gait problem.  Skin: Negative for color change.  Neurological: Negative for numbness and headaches.  Hematological: Negative for adenopathy.  Psychiatric/Behavioral: Negative for behavioral problems.  All other systems reviewed and are negative.    Allergies  Review of patient's allergies indicates no known allergies.  Home Medications   Current Outpatient Rx  Name Route Sig Dispense Refill  . AMLODIPINE BESYLATE 10 MG PO TABS Oral Take 10 mg by mouth daily.    . ASPIRIN EC 81 MG PO TBEC Oral Take 162 mg by mouth daily.    Marland Kitchen CLONIDINE HCL 0.1 MG PO TABS Oral Take 0.1 mg by mouth 3 (three) times daily.    Marland Kitchen HYDROXYZINE HCL 25 MG PO TABS Oral Take 25 mg by mouth 3 (three) times daily as needed. For itching    . LISINOPRIL 20 MG PO TABS Oral Take 20 mg by mouth daily.    Marland Kitchen LORAZEPAM 1 MG PO TABS Oral Take 1 mg by mouth every Monday, Wednesday, and Friday. Monday Wednesday and Friday before hemodialysis.    Marland Kitchen QUETIAPINE FUMARATE 200 MG PO TABS Oral Take 200 mg by mouth daily.      There were no vitals taken for this visit.  Physical Exam  Constitutional: He is oriented to person, place, and time. He appears well-developed and well-nourished.  HENT:  Head: Normocephalic and atraumatic.  Right Ear:  External ear normal.  Left Ear: External ear normal.  Nose: Nose normal.  Mouth/Throat: Oropharynx is clear and moist. No oropharyngeal exudate.  Eyes: Conjunctivae and EOM are normal. Pupils are equal, round, and reactive to light.  Neck: Normal range of motion. Neck supple.  Cardiovascular: Normal rate, regular rhythm, normal heart sounds and intact distal pulses.  Exam reveals no gallop and no friction rub.   No murmur heard. Pulmonary/Chest: Effort normal and breath sounds normal. No respiratory distress. He has no wheezes.  Abdominal: Soft. Bowel sounds are normal. He  exhibits no distension. There is tenderness ( mild diffuse, non focal ttp).  Musculoskeletal: Normal range of motion. He exhibits no edema and no tenderness.  Neurological: He is alert and oriented to person, place, and time.  Skin: Skin is warm and dry.  Psychiatric: He has a normal mood and affect. His behavior is normal.    ED Course  Procedures (including critical care time)  Labs Reviewed - No data to display No results found.   No diagnosis found.   Date: 07/03/2011  Rate: 65  Rhythm: normal sinus rhythm  QRS Axis: left  Intervals: PR prolonged  ST/T Wave abnormalities: normal  Conduction Disutrbances:first-degree A-V block   Narrative Interpretation: No peaked t waves consistent with hyperkalemia  Old EKG Reviewed: changes noted   MDM  9:39 AM 51 y.o. male w hx of ESRD on HD (MWF) pw missing dialysis on Monday and Wednesday of this week. Pt states that he had a recent inc in his clonidine to 4mg  which made him too sleepy to go to HD. Pt now pw mild sob and c/o mild diffuse non focal abd pain. Pt AFVSS here, comfortable on exam. Do not suspect emergent need for HD, will get chem 8.    Interpreted ecg which shows no evidence of hyperkalemia. Will give po kayexalate for mildly elev potassium and have pt go to dialysis tomorrow. I have discussed the diagnosis/risks/treatment options with the patient and believe the pt to be eligible for discharge home to follow-up at dialysis tomorrow. We also discussed returning to the ED immediately if new or worsening sx occur. We discussed the sx which are most concerning (e.g., worsening sob) that necessitate immediate return. Any new prescriptions provided to the patient are listed below.  New Prescriptions   No medications on file   Clinical Impression 1. SOB (shortness of breath)          Purvis Sheffield, MD 07/03/11 1644

## 2011-07-04 NOTE — ED Provider Notes (Signed)
  I performed a history and physical examination of Gabriel Bell and discussed his management with Dr. Romeo Apple.  I agree with the history, physical, assessment, and plan of care, with the following exceptions: None  This 51yo male's presentation is well described by Dr. Romeo Apple.  On my exam the patent was in no distress, w unremarkable VS.  The patient did have hyperkalemia, but given the absence of distress, ecg changes, or other acute changes, there was no indication for emergent dialysis, and the patient was d/c in stable condition to proceed to HD tomorrow.  Elyse Jarvis, MD 07/04/11 [phone]

## 2011-07-07 ENCOUNTER — Emergency Department (HOSPITAL_COMMUNITY)
Admission: EM | Admit: 2011-07-07 | Discharge: 2011-07-08 | Disposition: A | Discharge status: Home / Self Care | Payer: 59 | Attending: Emergency Medicine | Admitting: Emergency Medicine

## 2011-07-07 ENCOUNTER — Emergency Department (HOSPITAL_COMMUNITY): Payer: 59

## 2011-07-07 ENCOUNTER — Encounter (HOSPITAL_COMMUNITY): Payer: Self-pay | Admitting: Emergency Medicine

## 2011-07-07 DIAGNOSIS — D571 Sickle-cell disease without crisis: Secondary | ICD-10-CM | POA: Insufficient documentation

## 2011-07-07 DIAGNOSIS — N186 End stage renal disease: Secondary | ICD-10-CM | POA: Insufficient documentation

## 2011-07-07 DIAGNOSIS — E119 Type 2 diabetes mellitus without complications: Secondary | ICD-10-CM | POA: Insufficient documentation

## 2011-07-07 DIAGNOSIS — Z992 Dependence on renal dialysis: Secondary | ICD-10-CM | POA: Insufficient documentation

## 2011-07-07 DIAGNOSIS — I120 Hypertensive chronic kidney disease with stage 5 chronic kidney disease or end stage renal disease: Secondary | ICD-10-CM | POA: Insufficient documentation

## 2011-07-07 DIAGNOSIS — J40 Bronchitis, not specified as acute or chronic: Secondary | ICD-10-CM

## 2011-07-07 DIAGNOSIS — R1110 Vomiting, unspecified: Secondary | ICD-10-CM | POA: Insufficient documentation

## 2011-07-07 DIAGNOSIS — Z8673 Personal history of transient ischemic attack (TIA), and cerebral infarction without residual deficits: Secondary | ICD-10-CM | POA: Insufficient documentation

## 2011-07-07 HISTORY — DX: Unspecified kidney failure: N19

## 2011-07-07 LAB — CBC
HCT: 28.2 % — ABNORMAL LOW (ref 39.0–52.0)
Hemoglobin: 9.5 g/dL — ABNORMAL LOW (ref 13.0–17.0)
MCHC: 33.7 g/dL (ref 30.0–36.0)
MCV: 91.3 fL (ref 78.0–100.0)
RDW: 14.7 % (ref 11.5–15.5)
WBC: 14.5 10*3/uL — ABNORMAL HIGH (ref 4.0–10.5)

## 2011-07-07 LAB — DIFFERENTIAL
Basophils Absolute: 0.1 10*3/uL (ref 0.0–0.1)
Eosinophils Relative: 5 % (ref 0–5)
Lymphocytes Relative: 13 % (ref 12–46)
Monocytes Absolute: 1.1 10*3/uL — ABNORMAL HIGH (ref 0.1–1.0)
Monocytes Relative: 7 % (ref 3–12)

## 2011-07-07 LAB — BASIC METABOLIC PANEL
BUN: 48 mg/dL — ABNORMAL HIGH (ref 6–23)
CO2: 23 mEq/L (ref 19–32)
Calcium: 9.6 mg/dL (ref 8.4–10.5)
Creatinine, Ser: 14.11 mg/dL — ABNORMAL HIGH (ref 0.50–1.35)
Glucose, Bld: 198 mg/dL — ABNORMAL HIGH (ref 70–99)

## 2011-07-07 MED ORDER — BENZONATATE 100 MG PO CAPS
100.0000 mg | ORAL_CAPSULE | Freq: Once | ORAL | Status: AC
  Administered 2011-07-08: 100 mg via ORAL
  Filled 2011-07-07: qty 1

## 2011-07-07 MED ORDER — ALBUTEROL SULFATE HFA 108 (90 BASE) MCG/ACT IN AERS
2.0000 | INHALATION_SPRAY | Freq: Once | RESPIRATORY_TRACT | Status: AC
  Administered 2011-07-08: 2 via RESPIRATORY_TRACT
  Filled 2011-07-07: qty 6.7

## 2011-07-07 MED ORDER — MOXIFLOXACIN HCL 400 MG PO TABS
400.0000 mg | ORAL_TABLET | Freq: Once | ORAL | Status: AC
  Administered 2011-07-08: 400 mg via ORAL
  Filled 2011-07-07: qty 1

## 2011-07-07 NOTE — ED Notes (Signed)
PT. REPORTS NAUSEA AND VOMITTING  SINCE LAST Friday , MISSED HIS HEMODIALYSIS TODAY - STATES " I'M TOO SICK TO GO ".

## 2011-07-07 NOTE — ED Provider Notes (Signed)
History     [name]: [id number]  Arrival date & time 07/07/11  2040   First MD Initiated Contact with Patient 07/07/11 2311      Chief Complaint  Patient presents with  . Emesis    (Consider location/radiation/quality/duration/timing/severity/associated sxs/prior treatment) HPI Comments: 51 year old male with a history of hypertension, stroke, multiple myeloma, renal failure on dialysis for the last 2 and half. He states that over the last 4 days he's had a gradual onset of productive cough which is gradually worsening, constant, not associated with fevers or chills or rashes or swelling. The symptoms are moderate, not improved with over-the-counter medications. He had to miss dialysis today because he was coughing and did not feel comfortable leaving the house. He denies any lightheadedness or syncope or headaches.  [name] presenting with vomiting. The history is provided by the patient and medical records.  Emesis     Past Medical History  Diagnosis Date  . Diabetes mellitus   . ESRD on dialysis   . Hypertension   . Stroke     States he had 3 strokes at once  . Coronary artery disease     Coronary calcification seen on chest CT. Pt denies MI's   . Sickle cell anemia   . Renal failure     History reviewed. No pertinent past surgical history.  Family History  Problem Relation Age of Onset  . Heart disease Mother   . Heart disease Father     History  Substance Use Topics  . Smoking status: Never Smoker   . Smokeless tobacco: Not on file  . Alcohol Use: No      Review of Systems  Gastrointestinal: Positive for vomiting.  All other systems reviewed and are negative.    Allergies  Review of patient's allergies indicates no known allergies.  Home Medications   Current Outpatient Rx  Name Route Sig Dispense Refill  . ASPIRIN EC 81 MG PO TBEC Oral Take 162 mg by mouth daily.    Marland Kitchen CLONIDINE HCL 0.1 MG PO TABS Oral Take 0.2 mg by mouth 2 (two)  times daily.     Marland Kitchen HYDROXYZINE HCL 25 MG PO TABS Oral Take 25 mg by mouth 3 (three) times daily as needed. For itching    . LISINOPRIL 20 MG PO TABS Oral Take 20 mg by mouth daily.    Marland Kitchen LORAZEPAM 1 MG PO TABS Oral Take 1 mg by mouth every Monday, Wednesday, and Friday. Monday Wednesday and Friday before hemodialysis.    Marland Kitchen QUETIAPINE FUMARATE 200 MG PO TABS Oral Take 200 mg by mouth daily.    Marland Kitchen BENZONATATE 100 MG PO CAPS Oral Take 2 capsules (200 mg total) by mouth 2 (two) times daily as needed for cough. 20 capsule 0  . MOXIFLOXACIN HCL 400 MG PO TABS Oral Take 1 tablet (400 mg total) by mouth daily. 7 tablet 0    BP 167/75  Pulse 71  Temp(Src) 98.5 F (36.9 C) (Oral)  Resp 18  SpO2 100%  Physical Exam  Nursing note and vitals reviewed. Constitutional: He appears well-developed and well-nourished. No distress.  HENT:  Head: Normocephalic and atraumatic.  Mouth/Throat: Oropharynx is clear and moist. No oropharyngeal exudate.       Conjunctiva are clear, nares clear, no sinus tenderness, oropharynx clear, mucous membranes moist  Eyes: Conjunctivae and EOM are normal. Pupils are equal, round, and reactive to light. Right eye exhibits no discharge. Left eye exhibits no discharge.  No scleral icterus.  Neck: Normal range of motion. Neck supple. No JVD present. No thyromegaly present.  Cardiovascular: Normal rate, regular rhythm, normal heart sounds and intact distal pulses.  Exam reveals no gallop and no friction rub.   No murmur heard. Pulmonary/Chest: Effort normal and breath sounds normal. No respiratory distress. He has no wheezes. He has no rales.       Patient is able to take deep breaths, there is no tachypnea or respiratory distress and he is able to speak in full sentences without difficulty. He coughs occasionally throughout the exam  Abdominal: Soft. Bowel sounds are normal. He exhibits no distension and no mass. There is no tenderness.  Musculoskeletal: Normal range of motion. He  exhibits edema ( Minimal scant edema bilaterally). He exhibits no tenderness.  Lymphadenopathy:    He has no cervical adenopathy.  Neurological: He is alert. Coordination normal.  Skin: Skin is warm and dry. No rash noted. No erythema.  Psychiatric: He has a normal mood and affect. His behavior is normal.    ED Course  Procedures (including critical care time)  Labs Reviewed  CBC - Abnormal; Notable for the following:    WBC 14.5 (*)    RBC 3.09 (*)    Hemoglobin 9.5 (*)    HCT 28.2 (*)    Platelets 469 (*)    All other components within normal limits  DIFFERENTIAL - Abnormal; Notable for the following:    Neutro Abs 10.7 (*)    Monocytes Absolute 1.1 (*)    All other components within normal limits  BASIC METABOLIC PANEL - Abnormal; Notable for the following:    Sodium 132 (*)    Chloride 93 (*)    Glucose, Bld 198 (*)    BUN 48 (*)    Creatinine, Ser 14.11 (*)    GFR calc non Af Amer 3 (*)    GFR calc Af Amer 4 (*)    All other components within normal limits  URINALYSIS, ROUTINE W REFLEX MICROSCOPIC - Abnormal; Notable for the following:    Color, Urine STRAW (*)    Glucose, UA 250 (*)    Hgb urine dipstick SMALL (*)    Protein, ur >300 (*)    Leukocytes, UA TRACE (*)    All other components within normal limits  URINE MICROSCOPIC-ADD ON   Dg Chest 2 View  07/07/2011  *RADIOLOGY REPORT*  Clinical Data: Fever.  Productive cough.  Shortness of breath. Chest pain.  Patient missed dialysis.  CHEST - 2 VIEW  Comparison: Two-view chest x-ray 07/03/2011, and portable chest x- rays 07/03/2011 dating back to 06/16/2011.  Findings: Cardiac silhouette enlarged but stable.  Thoracic aorta mildly tortuous atherosclerotic, unchanged.  Hilar and mediastinal contours otherwise unremarkable.  Lungs clear.  Pulmonary vascularity normal without evidence of pulmonary edema.  No pleural effusions.  Visualized bony thorax intact.  IMPRESSION: Cardiomegaly.  No acute cardiopulmonary disease.   Original Report Authenticated By: Arnell Sieving, M.D.     1. Bronchitis       MDM  Dialysis patient who presents with normal oxygen levels, clear lungs, soft abdomen and no peripheral edema. His pulse is 75, he is slightly hypertensive but does not appear to have any signs of volume overload. Potassium is 4.7, sodium is 132, white blood cell count of 14,500.  I have personally reviewed his chest x-ray and find her to be no focal infiltrates, he likely has a bronchitis, he is actively coughing in the room with  productive thick phlegm. Will treat with oral antibiotics, albuterol MDI, followup with dialysis routinely. Patient is aware and able to express verbal understanding of the indications for return or urgent dialysis need  Patient has tolerated his medications, oxygen remains 100% without difficulty, x-ray reviewed by myself showing no focal infiltrates, patient appears stable for discharge, he is aware of his results. He is aware of indications for return  Discharge Prescriptions include:  #1 Tessalon #2 Avelox #3 albuterol MDI  Vida Roller, MD 07/08/11 [phone]

## 2011-07-07 NOTE — ED Notes (Signed)
Patient transported to X-ray 

## 2011-07-07 NOTE — ED Notes (Signed)
PT ambulated to restroom.

## 2011-07-08 LAB — URINALYSIS, ROUTINE W REFLEX MICROSCOPIC
Bilirubin Urine: NEGATIVE
Glucose, UA: 250 mg/dL — AB
Ketones, ur: NEGATIVE mg/dL
Protein, ur: >300 mg/dL — AB
pH: 8 (ref 5.0–8.0)

## 2011-07-08 LAB — URINE MICROSCOPIC-ADD ON

## 2011-07-08 MED ORDER — MOXIFLOXACIN HCL 400 MG PO TABS: 400.0000 mg | ORAL_TABLET | Freq: Every day | ORAL | Status: AC

## 2011-07-08 MED ORDER — BENZONATATE 100 MG PO CAPS: 200.0000 mg | ORAL_CAPSULE | Freq: Two times a day (BID) | ORAL | Status: AC | PRN

## 2011-07-08 NOTE — Discharge Instructions (Signed)
Your x-ray is normal showing no signs of pneumonia. There is no extra fluid on your lungs and your potassium level is normal. When he missed dialysis these things may become worsened and he may notice weakness or more difficulty breathing. If this occurs return to the hospital immediately. Otherwise followup with your dialysis appointment at her next appointment without fail.  Avelox once a day for 7 days Tessalon as prescribed for the cough Albuterol inhaler as we have given to you 2 puffs every 4 hours as needed for shortness of breath

## 2011-07-08 NOTE — ED Notes (Signed)
Pt sates he started coughing after leaving dialysis on Fri.  He missed his dialysis today b/c he kept "vomiting".  Pt with coughing up mucus large amounts of white mucus.  States abd pain that started after coughing.  Denies hx of asthma.  No wheezes noted bil.

## 2011-07-24 ENCOUNTER — Encounter (HOSPITAL_COMMUNITY): Payer: Self-pay | Admitting: *Deleted

## 2011-07-24 ENCOUNTER — Emergency Department (HOSPITAL_COMMUNITY)
Admission: EM | Admit: 2011-07-24 | Discharge: 2011-07-24 | Disposition: A | Discharge status: Home / Self Care | Payer: 59 | Attending: Emergency Medicine | Admitting: Emergency Medicine

## 2011-07-24 DIAGNOSIS — I120 Hypertensive chronic kidney disease with stage 5 chronic kidney disease or end stage renal disease: Secondary | ICD-10-CM | POA: Insufficient documentation

## 2011-07-24 DIAGNOSIS — R197 Diarrhea, unspecified: Secondary | ICD-10-CM | POA: Insufficient documentation

## 2011-07-24 DIAGNOSIS — N186 End stage renal disease: Secondary | ICD-10-CM | POA: Insufficient documentation

## 2011-07-24 DIAGNOSIS — Z992 Dependence on renal dialysis: Secondary | ICD-10-CM | POA: Insufficient documentation

## 2011-07-24 DIAGNOSIS — Z8673 Personal history of transient ischemic attack (TIA), and cerebral infarction without residual deficits: Secondary | ICD-10-CM | POA: Insufficient documentation

## 2011-07-24 DIAGNOSIS — D571 Sickle-cell disease without crisis: Secondary | ICD-10-CM | POA: Insufficient documentation

## 2011-07-24 DIAGNOSIS — R109 Unspecified abdominal pain: Secondary | ICD-10-CM | POA: Insufficient documentation

## 2011-07-24 LAB — CBC
HCT: 29.9 % — ABNORMAL LOW (ref 39.0–52.0)
Hemoglobin: 10.2 g/dL — ABNORMAL LOW (ref 13.0–17.0)
RBC: 3.26 MIL/uL — ABNORMAL LOW (ref 4.22–5.81)
WBC: 10.4 10*3/uL (ref 4.0–10.5)

## 2011-07-24 LAB — COMPREHENSIVE METABOLIC PANEL
AST: 16 U/L (ref 0–37)
Albumin: 3.9 g/dL (ref 3.5–5.2)
Alkaline Phosphatase: 95 U/L (ref 39–117)
BUN: 55 mg/dL — ABNORMAL HIGH (ref 6–23)
CO2: 20 mEq/L (ref 19–32)
Chloride: 97 mEq/L (ref 96–112)
Creatinine, Ser: 14.31 mg/dL — ABNORMAL HIGH (ref 0.50–1.35)
GFR calc non Af Amer: 3 mL/min — ABNORMAL LOW (ref >90)
Potassium: 5.1 mEq/L (ref 3.5–5.1)
Total Bilirubin: 0.3 mg/dL (ref 0.3–1.2)

## 2011-07-24 LAB — DIFFERENTIAL
Basophils Relative: 0 % (ref 0–1)
Lymphocytes Relative: 24 % (ref 12–46)
Monocytes Absolute: 0.7 10*3/uL (ref 0.1–1.0)
Monocytes Relative: 6 % (ref 3–12)
Neutro Abs: 6.8 10*3/uL (ref 1.7–7.7)
Neutrophils Relative %: 65 % (ref 43–77)

## 2011-07-24 LAB — POCT I-STAT TROPONIN I: Troponin i, poc: 0.04 ng/mL (ref 0.00–0.08)

## 2011-07-24 MED ORDER — DIPHENOXYLATE-ATROPINE 2.5-0.025 MG PO TABS
1.0000 | ORAL_TABLET | Freq: Once | ORAL | Status: AC
  Administered 2011-07-24: 1 via ORAL
  Filled 2011-07-24: qty 1

## 2011-07-24 MED ORDER — DICYCLOMINE HCL 10 MG PO CAPS
10.0000 mg | ORAL_CAPSULE | Freq: Once | ORAL | Status: AC
  Administered 2011-07-24: 10 mg via ORAL
  Filled 2011-07-24: qty 1

## 2011-07-24 MED ORDER — DIPHENOXYLATE-ATROPINE 2.5-0.025 MG PO TABS: 1.0000 | ORAL_TABLET | Freq: Four times a day (QID) | ORAL | Status: AC | PRN

## 2011-07-24 MED ORDER — ONDANSETRON HCL 4 MG PO TABS: 4.0000 mg | ORAL_TABLET | Freq: Four times a day (QID) | ORAL | Status: AC

## 2011-07-24 NOTE — ED Notes (Signed)
Attempted to stick pt x1, pt stated as needle touched skin, that needle was too painful and for this RN to remove. This RN obliged. Pt requesting IV team. This RN paged IV team.

## 2011-07-24 NOTE — ED Notes (Signed)
Pt told this RN that he had sudden onset of right sided CP. Thurston Hole, PA notified. Pt states, "It feels like someone punched me in the heart." EKG done. Provider at bedside.

## 2011-07-24 NOTE — ED Notes (Signed)
Diet tray ordered 

## 2011-07-24 NOTE — ED Provider Notes (Signed)
History     [name]: [id number]  Arrival date & time 07/24/11  1014   First MD Initiated Contact with Patient 07/24/11 1110      Chief Complaint  Patient presents with  . Abdominal Pain    (Consider location/radiation/quality/duration/timing/severity/associated sxs/prior treatment) [name] presenting with abdominal pain and diarrhea.  Abdominal Pain The primary symptoms of the illness include abdominal pain and diarrhea.  Symptoms associated with the illness do not include chills, constipation or back pain.  Diarrhea The primary symptoms include abdominal pain and diarrhea. The illness began 3 to 5 days ago. The onset was gradual. The problem has not changed since onset. The abdominal pain began more than 2 days ago. The abdominal pain has been unchanged since its onset. The abdominal pain is generalized. The abdominal pain does not radiate. The severity of the abdominal pain is 5/10.  The illness is also significant for bloating. The illness does not include chills, constipation or back pain.   51 year old patient coming in today with complaint of diarrhea x4 days. States that his wife as the same symptoms except for she has a cold with her diarrhea. States he has general abdominal pain with the diarrhea.States that he has had > 5 times a day.  Denies blood in his stool.   Denies fever nausea vomiting. Also end-stage renal disease. His dialysis is Monday Wednesday and Friday. He has not been to dialysis since last Friday. States that he does not like the nurse at the facility. States that he wants to transfer to [facility] to get away from this facility. No acute distress notice. Patient is moving around in the bed sitting up then lying down as he is talking to me.    Past Medical History  Diagnosis Date  . ESRD on dialysis   . Hypertension   . Stroke     States he had 3 strokes at once  . Sickle cell anemia   . Renal failure     Past Surgical History  Procedure Date    . Kidney stone surgery     Family History  Problem Relation Age of Onset  . Heart disease Mother   . Heart disease Father     History  Substance Use Topics  . Smoking status: Never Smoker   . Smokeless tobacco: Not on file  . Alcohol Use: No      Review of Systems  Constitutional: Negative for chills.  Gastrointestinal: Positive for abdominal pain, diarrhea and bloating. Negative for constipation.  Musculoskeletal: Negative for back pain.    Allergies  Review of patient's allergies indicates no known allergies.  Home Medications   Current Outpatient Rx  Name Route Sig Dispense Refill  . ASPIRIN EC 81 MG PO TBEC Oral Take 162 mg by mouth daily.    Marland Kitchen CLONIDINE HCL 0.1 MG PO TABS Oral Take 0.2 mg by mouth 2 (two) times daily.     Marland Kitchen HYDROXYZINE HCL 25 MG PO TABS Oral Take 25 mg by mouth 3 (three) times daily as needed. For itching    . LISINOPRIL 20 MG PO TABS Oral Take 20 mg by mouth daily.    Marland Kitchen LORAZEPAM 1 MG PO TABS Oral Take 1 mg by mouth every Monday, Wednesday, and Friday. Monday Wednesday and Friday before hemodialysis.    Marland Kitchen QUETIAPINE FUMARATE 200 MG PO TABS Oral Take 200 mg by mouth daily.      BP 189/98  Pulse 71  Temp(Src) 98.3 F (  36.8 C) (Oral)  Resp 18  Ht 5\' 10"  (1.778 m)  Wt 317 lb (143.79 kg)  BMI 45.48 kg/m2  SpO2 98%  Physical Exam  Nursing note and vitals reviewed. Constitutional: He is oriented to person, place, and time. He appears well-developed and well-nourished.  HENT:  Head: Normocephalic.  Eyes: Conjunctivae and EOM are normal. Pupils are equal, round, and reactive to light.  Neck: Normal range of motion. Neck supple.  Cardiovascular: Normal rate.   Murmur heard. Pulmonary/Chest: Effort normal and breath sounds normal. No respiratory distress. He has no wheezes.  Abdominal: Soft. He exhibits distension. He exhibits no mass. There is tenderness. There is no rebound and no guarding.  Musculoskeletal: Normal range of motion.        Graft site with bruitt and thrill to LUE  Neurological: He is alert and oriented to person, place, and time.  Skin: Skin is warm and dry.  Psychiatric: He has a normal mood and affect.    ED Course  Procedures (including critical care time)   Labs Reviewed  CBC  DIFFERENTIAL  COMPREHENSIVE METABOLIC PANEL   No results found.   No diagnosis found.    MDM   Date: 07/24/2011  Rate: 65  Rhythm: normal sinus rhythm  QRS Axis: normal  Intervals: normal  ST/T Wave abnormalities: normal  Conduction Disutrbances:first-degree A-V block   Narrative Interpretation:   Old EKG Reviewed: unchanged  No further diarrhea in the ER. Controlled with lomotil and bentyl.  Tolerating po's.  Fleeting chest pain with unchanged EKG and negative troponin.  Will follow up with pcp and go to dialysis tomorrow. Labs Reviewed  CBC - Abnormal; Notable for the following:    RBC 3.26 (*)    Hemoglobin 10.2 (*)    HCT 29.9 (*)    All other components within normal limits  COMPREHENSIVE METABOLIC PANEL - Abnormal; Notable for the following:    Sodium 134 (*)    BUN 55 (*)    Creatinine, Ser 14.31 (*)    GFR calc non Af Amer 3 (*)    GFR calc Af Amer 4 (*)    All other components within normal limits  DIFFERENTIAL  POCT I-STAT TROPONIN I          Remi Haggard, NP 07/24/11 2024

## 2011-07-24 NOTE — Discharge Instructions (Signed)
Gabriel Bell we gave you medication in the ER for your diarrhea.  Lomotil for the diarrhea and bentyl for the abdominal cramping.  Chest pain we did an EKG which was normal and the troponin which was also normal. Get your dialysis tomorrow. Your potassium today was 5.1. Stay on a liquid diet and progressed to a brat diet tonight. Given new prescriptions for nausea, diarrhea and abdominal cramping.   Clear Liquid Diet The clear liquid dietconsists of foods that are liquid or will become liquid at room temperature.You should be able to see through the liquid and beverages. Examples of foods allowed on a clear liquid diet include fruit juice, broth or bouillon, gelatin, or frozen ice pops. The purpose of this diet is to provide necessary fluid, electrolytes such as sodium and potassium, and energy to keep the body functioning during times when you are not able to consume a regular diet.A clear liquid diet should not be continued for long periods of time as it is not nutritionally adequate.  REASONS FOR USING A CLEAR LIQUID DIET  In sudden onset (acute) conditions for a patient before or after surgery.   As the first step in oral feeding.   For fluid and electrolyte replacement in diarrheal diseases.   As a diet before certain medical tests are performed.  ADEQUACY The clear liquid diet is adequate only in ascorbic acid, according to the Recommended Dietary Allowances of the [employer]. CHOOSING FOODS Breads and Starches  Allowed:  None are allowed.   Avoid: All are avoided.  Vegetables  Allowed:  Strained tomato or vegetable juice.   Avoid: Any others.  Fruit  Allowed:  Strained fruit juices and fruit drinks. Include 1 serving of citrus or vitamin C-enriched fruit juice daily.   Avoid: Any others.  Meat and Meat Substitutes  Allowed:  None are allowed.   Avoid: All are avoided.  Milk  Allowed:  None are allowed.   Avoid: All are avoided.  Soups and Combination  Foods  Allowed:  Clear bouillon, broth, or strained broth-based soups.   Avoid: Any others.  Desserts and Sweets  Allowed:  Sugar, honey. High protein gelatin. Flavored gelatin, ices, or frozen ice pops that do not contain milk.   Avoid: Any others.  Fats and Oils  Allowed:  None are allowed.   Avoid: All are avoided.  Beverages  Allowed: Cereal beverages, coffee (regular or decaffeinated), tea, or soda at the discretion of your caregiver.   Avoid: Any others.  Condiments  Allowed:  Iodized salt.   Avoid: Any others, including pepper.  Supplements  Allowed:  Liquid nutrition beverages.   Avoid: Any others that contain lactose or fiber.  SAMPLE MEAL PLAN Breakfast  4 oz (120 mL) strained orange juice.    to 1 cup (125 to 250 mL) gelatin (plain or fortified).   1 cup (250 mL) beverage (coffee or tea).   Sugar, if desired.  Midmorning Snack   cup (125 mL) gelatin (plain or fortified).  Lunch  1 cup (250 mL) broth or consomm.   4 oz (120 mL) strained grapefruit juice.    cup (125 mL) gelatin (plain or fortified).   1 cup (250 mL) beverage (coffee or tea).   Sugar, if desired.  Midafternoon Snack   cup (125 mL) fruit ice.    cup (125 mL) strained fruit juice.  Dinner  1 cup (250 mL) broth or consomm.    cup (125 mL) cranberry juice.    cup (125  mL) flavored gelatin (plain or fortified).   1 cup (250 mL) beverage (coffee or tea).   Sugar, if desired.  Evening Snack  4 oz (120 mL) strained apple juice (vitamin C-fortified).    cup (125 mL) flavored gelatin (plain or fortified).  Document Released: 02/03/2005 Document Revised: 01/23/2011 Document Reviewed: 05/03/2010 [facility] Patient Information 2012 [place], [place].Clear Liquid Diet The clear liquid dietconsists of foods that are liquid or will become liquid at room temperature.You should be able to see through the liquid and beverages. Examples of foods allowed on a clear liquid diet  include fruit juice, broth or bouillon, gelatin, or frozen ice pops. The purpose of this diet is to provide necessary fluid, electrolytes such as sodium and potassium, and energy to keep the body functioning during times when you are not able to consume a regular diet.A clear liquid diet should not be continued for long periods of time as it is not nutritionally adequate.  REASONS FOR USING A CLEAR LIQUID DIET  In sudden onset (acute) conditions for a patient before or after surgery.   As the first step in oral feeding.   For fluid and electrolyte replacement in diarrheal diseases.   As a diet before certain medical tests are performed.  ADEQUACY The clear liquid diet is adequate only in ascorbic acid, according to the Recommended Dietary Allowances of the [employer]. CHOOSING FOODS Breads and Starches  Allowed:  None are allowed.   Avoid: All are avoided.  Vegetables  Allowed:  Strained tomato or vegetable juice.   Avoid: Any others.  Fruit  Allowed:  Strained fruit juices and fruit drinks. Include 1 serving of citrus or vitamin C-enriched fruit juice daily.   Avoid: Any others.  Meat and Meat Substitutes  Allowed:  None are allowed.   Avoid: All are avoided.  Milk  Allowed:  None are allowed.   Avoid: All are avoided.  Soups and Combination Foods  Allowed:  Clear bouillon, broth, or strained broth-based soups.   Avoid: Any others.  Desserts and Sweets  Allowed:  Sugar, honey. High protein gelatin. Flavored gelatin, ices, or frozen ice pops that do not contain milk.   Avoid: Any others.  Fats and Oils  Allowed:  None are allowed.   Avoid: All are avoided.  Beverages  Allowed: Cereal beverages, coffee (regular or decaffeinated), tea, or soda at the discretion of your caregiver.   Avoid: Any others.  Condiments  Allowed:  Iodized salt.   Avoid: Any others, including pepper.  Supplements  Allowed:  Liquid nutrition beverages.   Avoid:  Any others that contain lactose or fiber.  SAMPLE MEAL PLAN Breakfast  4 oz (120 mL) strained orange juice.    to 1 cup (125 to 250 mL) gelatin (plain or fortified).   1 cup (250 mL) beverage (coffee or tea).   Sugar, if desired.  Midmorning Snack   cup (125 mL) gelatin (plain or fortified).  Lunch  1 cup (250 mL) broth or consomm.   4 oz (120 mL) strained grapefruit juice.    cup (125 mL) gelatin (plain or fortified).   1 cup (250 mL) beverage (coffee or tea).   Sugar, if desired.  Midafternoon Snack   cup (125 mL) fruit ice.    cup (125 mL) strained fruit juice.  Dinner  1 cup (250 mL) broth or consomm.    cup (125 mL) cranberry juice.    cup (125 mL) flavored gelatin (plain or fortified).   1 cup (250  mL) beverage (coffee or tea).   Sugar, if desired.  Evening Snack  4 oz (120 mL) strained apple juice (vitamin C-fortified).    cup (125 mL) flavored gelatin (plain or fortified).  Document Released: 02/03/2005 Document Revised: 01/23/2011 Document Reviewed: 05/03/2010 [facility] Patient Information 2012 [place], [place].B.R.A.T. Diet Your doctor has recommended the B.R.A.T. diet for you or your child until the condition improves. This is often used to help control diarrhea and vomiting symptoms. If you or your child can tolerate clear liquids, you may have:  Bananas.   Rice.   Applesauce.   Toast (and other simple starches such as crackers, potatoes, noodles).  Be sure to avoid dairy products, meats, and fatty foods until symptoms are better. Fruit juices such as apple, grape, and prune juice can make diarrhea worse. Avoid these. Continue this diet for 2 days or as instructed by your caregiver. Document Released: 02/03/2005 Document Revised: 01/23/2011 Document Reviewed: 07/23/2006 [facility] Patient Information 2012 [place], [place].B.R.A.T. Diet Your doctor has recommended the B.R.A.T. diet for you or your child until the condition improves. This is  often used to help control diarrhea and vomiting symptoms. If you or your child can tolerate clear liquids, you may have:  Bananas.   Rice.   Applesauce.   Toast (and other simple starches such as crackers, potatoes, noodles).  Be sure to avoid dairy products, meats, and fatty foods until symptoms are better. Fruit juices such as apple, grape, and prune juice can make diarrhea worse. Avoid these. Continue this diet for 2 days or as instructed by your caregiver. Document Released: 02/03/2005 Document Revised: 01/23/2011 Document Reviewed: 07/23/2006 [facility] Patient Information 2012 [place], [place].

## 2011-07-24 NOTE — ED Notes (Signed)
Provider notified of pt BP and that pt has not taken BP meds in 2 days

## 2011-07-24 NOTE — ED Notes (Signed)
Patient reports onset of abd pain and diarrhea since Sunday.

## 2011-07-24 NOTE — ED Notes (Signed)
Pt was yelling at this RN, this RN walked out of room per pt request. Charge nurse, Efraim Kaufmann, notified by this RN per pt request.

## 2011-07-25 NOTE — ED Provider Notes (Signed)
Medical screening examination/treatment/procedure(s) were performed by non-physician practitioner and as supervising physician I was immediately available for consultation/collaboration.    Celene Kras, MD 07/25/11 1320

## 2011-08-05 ENCOUNTER — Other Ambulatory Visit (HOSPITAL_COMMUNITY): Payer: Self-pay | Admitting: Nephrology

## 2011-08-05 DIAGNOSIS — N186 End stage renal disease: Secondary | ICD-10-CM

## 2011-08-07 ENCOUNTER — Ambulatory Visit (HOSPITAL_COMMUNITY): Payer: No Typology Code available for payment source | Attending: Nephrology

## 2011-08-19 ENCOUNTER — Other Ambulatory Visit: Payer: Self-pay | Admitting: Internal Medicine

## 2012-01-03 ENCOUNTER — Emergency Department (HOSPITAL_COMMUNITY)
Admission: EM | Admit: 2012-01-03 | Discharge: 2012-01-03 | Disposition: A | Discharge status: Home / Self Care | Payer: PRIVATE HEALTH INSURANCE | Attending: Emergency Medicine | Admitting: Emergency Medicine

## 2012-01-03 ENCOUNTER — Emergency Department (HOSPITAL_COMMUNITY): Payer: PRIVATE HEALTH INSURANCE

## 2012-01-03 DIAGNOSIS — D571 Sickle-cell disease without crisis: Secondary | ICD-10-CM | POA: Insufficient documentation

## 2012-01-03 DIAGNOSIS — Z8673 Personal history of transient ischemic attack (TIA), and cerebral infarction without residual deficits: Secondary | ICD-10-CM | POA: Insufficient documentation

## 2012-01-03 DIAGNOSIS — Z7982 Long term (current) use of aspirin: Secondary | ICD-10-CM | POA: Insufficient documentation

## 2012-01-03 DIAGNOSIS — S3981XA Other specified injuries of abdomen, initial encounter: Secondary | ICD-10-CM | POA: Insufficient documentation

## 2012-01-03 DIAGNOSIS — S0993XA Unspecified injury of face, initial encounter: Secondary | ICD-10-CM | POA: Insufficient documentation

## 2012-01-03 DIAGNOSIS — Y92410 Unspecified street and highway as the place of occurrence of the external cause: Secondary | ICD-10-CM | POA: Insufficient documentation

## 2012-01-03 DIAGNOSIS — S0990XA Unspecified injury of head, initial encounter: Secondary | ICD-10-CM | POA: Insufficient documentation

## 2012-01-03 DIAGNOSIS — I120 Hypertensive chronic kidney disease with stage 5 chronic kidney disease or end stage renal disease: Secondary | ICD-10-CM | POA: Insufficient documentation

## 2012-01-03 DIAGNOSIS — N186 End stage renal disease: Secondary | ICD-10-CM | POA: Insufficient documentation

## 2012-01-03 DIAGNOSIS — Y9389 Activity, other specified: Secondary | ICD-10-CM | POA: Insufficient documentation

## 2012-01-03 DIAGNOSIS — Z79899 Other long term (current) drug therapy: Secondary | ICD-10-CM | POA: Insufficient documentation

## 2012-01-03 DIAGNOSIS — S298XXA Other specified injuries of thorax, initial encounter: Secondary | ICD-10-CM | POA: Insufficient documentation

## 2012-01-03 DIAGNOSIS — S199XXA Unspecified injury of neck, initial encounter: Secondary | ICD-10-CM | POA: Insufficient documentation

## 2012-01-03 LAB — URINALYSIS, MICROSCOPIC ONLY
Bilirubin Urine: NEGATIVE
Glucose, UA: 100 mg/dL — AB
Protein, ur: >300 mg/dL — AB
Specific Gravity, Urine: 1.019 (ref 1.005–1.030)

## 2012-01-03 LAB — LACTIC ACID, PLASMA: Lactic Acid, Venous: 0.8 mmol/L (ref 0.5–2.2)

## 2012-01-03 LAB — CBC
HCT: 40.8 % (ref 39.0–52.0)
Hemoglobin: 13.7 g/dL (ref 13.0–17.0)
MCHC: 33.6 g/dL (ref 30.0–36.0)
MCV: 88.9 fL (ref 78.0–100.0)

## 2012-01-03 LAB — COMPREHENSIVE METABOLIC PANEL
ALT: 18 U/L (ref 0–53)
Albumin: 3.8 g/dL (ref 3.5–5.2)
Alkaline Phosphatase: 120 U/L — ABNORMAL HIGH (ref 39–117)
Calcium: 9.5 mg/dL (ref 8.4–10.5)
GFR calc Af Amer: 9 mL/min — ABNORMAL LOW (ref >90)
Glucose, Bld: 94 mg/dL (ref 70–99)
Potassium: 4 mEq/L (ref 3.5–5.1)
Sodium: 136 mEq/L (ref 135–145)
Total Protein: 7.5 g/dL (ref 6.0–8.3)

## 2012-01-03 LAB — PROTIME-INR
INR: 0.91 (ref 0.00–1.49)
Prothrombin Time: 12.2 seconds (ref 11.6–15.2)

## 2012-01-03 LAB — POCT I-STAT, CHEM 8
BUN: 21 mg/dL (ref 6–23)
Calcium, Ion: 1.07 mmol/L — ABNORMAL LOW (ref 1.12–1.23)
Creatinine, Ser: 6.8 mg/dL — ABNORMAL HIGH (ref 0.50–1.35)
TCO2: 29 mmol/L (ref 0–100)

## 2012-01-03 MED ORDER — MORPHINE SULFATE 4 MG/ML IJ SOLN
4.0000 mg | INTRAMUSCULAR | Status: DC | PRN
  Administered 2012-01-03 (×2): 4 mg via INTRAVENOUS
  Filled 2012-01-03 (×2): qty 1

## 2012-01-03 MED ORDER — IOHEXOL 300 MG/ML  SOLN
125.0000 mL | Freq: Once | INTRAMUSCULAR | Status: AC | PRN
  Administered 2012-01-03: 125 mL via INTRAVENOUS

## 2012-01-03 MED ORDER — OXYCODONE-ACETAMINOPHEN 5-325 MG PO TABS
2.0000 | ORAL_TABLET | Freq: Once | ORAL | Status: AC
  Administered 2012-01-03: 2 via ORAL
  Filled 2012-01-03: qty 2

## 2012-01-03 NOTE — ED Notes (Signed)
Pt removed c-collar and sat up to use urinal. Pt informed not to sit up and c-collar must stay in place until evaluated by a provider. Pt assisted back to a lying position in bed and c-collar replaced. Call light with in reach. Side rails up x2 . Bed in lowest position.

## 2012-01-03 NOTE — ED Notes (Signed)
Jeraldine Loots, MD notified of current blood pressure.
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
## 2012-01-03 NOTE — ED Notes (Signed)
Pt provided warm blankets for comfort

## 2012-01-03 NOTE — ED Notes (Signed)
Pt transported to CT via stretcher with tech.  
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
## 2012-01-03 NOTE — ED Notes (Signed)
[place], MD at bedside.  
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
## 2012-01-08 ENCOUNTER — Emergency Department (INDEPENDENT_AMBULATORY_CARE_PROVIDER_SITE_OTHER)
Admission: EM | Admit: 2012-01-08 | Discharge: 2012-01-08 | Disposition: A | Discharge status: Nursing Home (Includes ICF) | Payer: Self-pay | Source: Home / Self Care | Attending: Emergency Medicine | Admitting: Emergency Medicine

## 2012-01-08 ENCOUNTER — Emergency Department (HOSPITAL_COMMUNITY): Payer: 59

## 2012-01-08 ENCOUNTER — Emergency Department (HOSPITAL_COMMUNITY)
Admission: EM | Admit: 2012-01-08 | Discharge: 2012-01-09 | Disposition: A | Discharge status: Home / Self Care | Payer: 59 | Attending: Emergency Medicine | Admitting: Emergency Medicine

## 2012-01-08 ENCOUNTER — Encounter (HOSPITAL_COMMUNITY): Payer: Self-pay | Admitting: Emergency Medicine

## 2012-01-08 ENCOUNTER — Encounter (HOSPITAL_COMMUNITY): Payer: Self-pay | Admitting: *Deleted

## 2012-01-08 DIAGNOSIS — Y9389 Activity, other specified: Secondary | ICD-10-CM | POA: Insufficient documentation

## 2012-01-08 DIAGNOSIS — Z862 Personal history of diseases of the blood and blood-forming organs and certain disorders involving the immune mechanism: Secondary | ICD-10-CM | POA: Insufficient documentation

## 2012-01-08 DIAGNOSIS — Z7982 Long term (current) use of aspirin: Secondary | ICD-10-CM | POA: Insufficient documentation

## 2012-01-08 DIAGNOSIS — N186 End stage renal disease: Secondary | ICD-10-CM | POA: Insufficient documentation

## 2012-01-08 DIAGNOSIS — Z8673 Personal history of transient ischemic attack (TIA), and cerebral infarction without residual deficits: Secondary | ICD-10-CM | POA: Insufficient documentation

## 2012-01-08 DIAGNOSIS — T07XXXA Unspecified multiple injuries, initial encounter: Secondary | ICD-10-CM | POA: Insufficient documentation

## 2012-01-08 DIAGNOSIS — R079 Chest pain, unspecified: Secondary | ICD-10-CM

## 2012-01-08 DIAGNOSIS — Z79899 Other long term (current) drug therapy: Secondary | ICD-10-CM | POA: Insufficient documentation

## 2012-01-08 DIAGNOSIS — I120 Hypertensive chronic kidney disease with stage 5 chronic kidney disease or end stage renal disease: Secondary | ICD-10-CM | POA: Insufficient documentation

## 2012-01-08 LAB — COMPREHENSIVE METABOLIC PANEL
ALT: 16 U/L (ref 0–53)
Alkaline Phosphatase: 117 U/L (ref 39–117)
GFR calc Af Amer: 6 mL/min — ABNORMAL LOW (ref >90)
Glucose, Bld: 117 mg/dL — ABNORMAL HIGH (ref 70–99)
Potassium: 5.3 mEq/L — ABNORMAL HIGH (ref 3.5–5.1)
Sodium: 130 mEq/L — ABNORMAL LOW (ref 135–145)
Total Protein: 8 g/dL (ref 6.0–8.3)

## 2012-01-08 LAB — CBC WITH DIFFERENTIAL/PLATELET
Blasts: 0 %
Eosinophils Absolute: 0.6 10*3/uL (ref 0.0–0.7)
MCH: 29.7 pg (ref 26.0–34.0)
MCHC: 33.4 g/dL (ref 30.0–36.0)
MCV: 88.8 fL (ref 78.0–100.0)
Metamyelocytes Relative: 0 %
Myelocytes: 0 %
Platelets: 343 10*3/uL (ref 150–400)
Promyelocytes Absolute: 0 %
RDW: 14.9 % (ref 11.5–15.5)
nRBC: 0 /100 WBC

## 2012-01-08 MED ORDER — KETOROLAC TROMETHAMINE 30 MG/ML IJ SOLN
30.0000 mg | Freq: Once | INTRAMUSCULAR | Status: AC
  Administered 2012-01-08: 30 mg via INTRAVENOUS
  Filled 2012-01-08: qty 1

## 2012-01-08 MED ORDER — MORPHINE SULFATE 2 MG/ML IJ SOLN
2.0000 mg | Freq: Once | INTRAMUSCULAR | Status: AC
  Administered 2012-01-08: 2 mg via INTRAMUSCULAR

## 2012-01-08 MED ORDER — MORPHINE SULFATE 2 MG/ML IJ SOLN
INTRAMUSCULAR | Status: AC
  Filled 2012-01-08: qty 1

## 2012-01-08 MED ORDER — HYDROMORPHONE HCL PF 1 MG/ML IJ SOLN
1.0000 mg | Freq: Once | INTRAMUSCULAR | Status: AC
  Administered 2012-01-08: 1 mg via INTRAVENOUS
  Filled 2012-01-08: qty 1

## 2012-01-08 MED ORDER — SODIUM CHLORIDE 0.9 % IV BOLUS (SEPSIS)
1000.0000 mL | Freq: Once | INTRAVENOUS | Status: AC
  Administered 2012-01-08: 1000 mL via INTRAVENOUS

## 2012-01-08 MED ORDER — HYDROCODONE-ACETAMINOPHEN 5-325 MG PO TABS: 2.0000 | ORAL_TABLET | Freq: Four times a day (QID) | ORAL | Status: AC | PRN

## 2012-01-08 NOTE — ED Notes (Signed)
Patient transported to X-ray 
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
## 2012-01-08 NOTE — ED Notes (Signed)
MD at bedside. 
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
## 2012-04-19 ENCOUNTER — Emergency Department (HOSPITAL_COMMUNITY): Payer: Medicare Other

## 2012-04-19 ENCOUNTER — Encounter (HOSPITAL_COMMUNITY): Payer: Self-pay | Admitting: *Deleted

## 2012-04-19 ENCOUNTER — Inpatient Hospital Stay (HOSPITAL_COMMUNITY)
Admission: EM | Admit: 2012-04-19 | Discharge: 2012-04-22 | DRG: 291 | Disposition: A | Discharge status: Home / Self Care | Payer: Medicare Other | Attending: Internal Medicine | Admitting: Internal Medicine

## 2012-04-19 DIAGNOSIS — N189 Chronic kidney disease, unspecified: Secondary | ICD-10-CM

## 2012-04-19 DIAGNOSIS — Z79899 Other long term (current) drug therapy: Secondary | ICD-10-CM

## 2012-04-19 DIAGNOSIS — D571 Sickle-cell disease without crisis: Secondary | ICD-10-CM | POA: Diagnosis present

## 2012-04-19 DIAGNOSIS — Z8673 Personal history of transient ischemic attack (TIA), and cerebral infarction without residual deficits: Secondary | ICD-10-CM

## 2012-04-19 DIAGNOSIS — Z992 Dependence on renal dialysis: Secondary | ICD-10-CM

## 2012-04-19 DIAGNOSIS — I161 Hypertensive emergency: Secondary | ICD-10-CM

## 2012-04-19 DIAGNOSIS — N2581 Secondary hyperparathyroidism of renal origin: Secondary | ICD-10-CM | POA: Diagnosis present

## 2012-04-19 DIAGNOSIS — D631 Anemia in chronic kidney disease: Secondary | ICD-10-CM

## 2012-04-19 DIAGNOSIS — I10 Essential (primary) hypertension: Secondary | ICD-10-CM | POA: Diagnosis present

## 2012-04-19 DIAGNOSIS — R079 Chest pain, unspecified: Secondary | ICD-10-CM

## 2012-04-19 DIAGNOSIS — E8770 Fluid overload, unspecified: Secondary | ICD-10-CM | POA: Diagnosis present

## 2012-04-19 DIAGNOSIS — F3112 Bipolar disorder, current episode manic without psychotic features, moderate: Secondary | ICD-10-CM | POA: Diagnosis present

## 2012-04-19 DIAGNOSIS — Z91199 Patient's noncompliance with other medical treatment and regimen due to unspecified reason: Secondary | ICD-10-CM

## 2012-04-19 DIAGNOSIS — I5023 Acute on chronic systolic (congestive) heart failure: Principal | ICD-10-CM | POA: Diagnosis present

## 2012-04-19 DIAGNOSIS — I120 Hypertensive chronic kidney disease with stage 5 chronic kidney disease or end stage renal disease: Secondary | ICD-10-CM | POA: Diagnosis present

## 2012-04-19 DIAGNOSIS — F319 Bipolar disorder, unspecified: Secondary | ICD-10-CM | POA: Diagnosis present

## 2012-04-19 DIAGNOSIS — R911 Solitary pulmonary nodule: Secondary | ICD-10-CM

## 2012-04-19 DIAGNOSIS — J811 Chronic pulmonary edema: Secondary | ICD-10-CM | POA: Diagnosis present

## 2012-04-19 DIAGNOSIS — Z9119 Patient's noncompliance with other medical treatment and regimen: Secondary | ICD-10-CM

## 2012-04-19 DIAGNOSIS — Z7982 Long term (current) use of aspirin: Secondary | ICD-10-CM

## 2012-04-19 DIAGNOSIS — N186 End stage renal disease: Secondary | ICD-10-CM | POA: Diagnosis present

## 2012-04-19 LAB — CBC WITH DIFFERENTIAL/PLATELET
Basophils Absolute: 0 10*3/uL (ref 0.0–0.1)
Eosinophils Absolute: 0.6 10*3/uL (ref 0.0–0.7)
Lymphocytes Relative: 27 % (ref 12–46)
Lymphs Abs: 2.2 10*3/uL (ref 0.7–4.0)
MCH: 29.9 pg (ref 26.0–34.0)
Neutrophils Relative %: 60 % (ref 43–77)
Platelets: 292 10*3/uL (ref 150–400)
RBC: 3.51 MIL/uL — ABNORMAL LOW (ref 4.22–5.81)
RDW: 15.2 % (ref 11.5–15.5)
WBC: 8.5 10*3/uL (ref 4.0–10.5)

## 2012-04-19 LAB — COMPREHENSIVE METABOLIC PANEL
BUN: 54 mg/dL — ABNORMAL HIGH (ref 6–23)
CO2: 25 mEq/L (ref 19–32)
Chloride: 97 mEq/L (ref 96–112)
Creatinine, Ser: 15.99 mg/dL — ABNORMAL HIGH (ref 0.50–1.35)
GFR calc non Af Amer: 3 mL/min — ABNORMAL LOW (ref >90)
Total Bilirubin: 0.3 mg/dL (ref 0.3–1.2)

## 2012-04-19 LAB — POCT I-STAT TROPONIN I: Troponin i, poc: 0.06 ng/mL (ref 0.00–0.08)

## 2012-04-19 LAB — TROPONIN I: Troponin I: <0.3 ng/mL (ref <0.30)

## 2012-04-19 MED ORDER — NITROGLYCERIN IN D5W 200-5 MCG/ML-% IV SOLN
10.0000 ug/min | INTRAVENOUS | Status: DC
  Administered 2012-04-20: 5 ug/min via INTRAVENOUS
  Administered 2012-04-20: 10 ug/min via INTRAVENOUS
  Filled 2012-04-19: qty 250

## 2012-04-19 MED ORDER — LISINOPRIL 10 MG PO TABS: 10.0000 mg | ORAL_TABLET | Freq: Every day | ORAL | Status: DC

## 2012-04-19 MED ORDER — CALCIUM ACETATE 667 MG PO CAPS
667.0000 mg | ORAL_CAPSULE | Freq: Two times a day (BID) | ORAL | Status: DC | PRN
  Filled 2012-04-19: qty 1

## 2012-04-19 MED ORDER — CARVEDILOL 25 MG PO TABS
25.0000 mg | ORAL_TABLET | Freq: Two times a day (BID) | ORAL | Status: DC
  Administered 2012-04-20 (×2): 25 mg via ORAL
  Filled 2012-04-19 (×5): qty 1

## 2012-04-19 MED ORDER — AMLODIPINE BESYLATE 10 MG PO TABS: 10.0000 mg | ORAL_TABLET | Freq: Every day | ORAL | Status: DC

## 2012-04-19 MED ORDER — LIDOCAINE-PRILOCAINE 2.5-2.5 % EX CREA: 1.0000 "application " | TOPICAL_CREAM | CUTANEOUS | Status: DC | PRN

## 2012-04-19 MED ORDER — HEPARIN SODIUM (PORCINE) 1000 UNIT/ML DIALYSIS: 1000.0000 [IU] | INTRAMUSCULAR | Status: DC | PRN

## 2012-04-19 MED ORDER — DOXERCALCIFEROL 4 MCG/2ML IV SOLN
6.0000 ug | INTRAVENOUS | Status: DC
  Administered 2012-04-21: 2 ug via INTRAVENOUS
  Filled 2012-04-19: qty 4

## 2012-04-19 MED ORDER — CALCIUM ACETATE 667 MG PO CAPS: 667.0000 mg | ORAL_CAPSULE | Freq: Every day | ORAL | Status: DC

## 2012-04-19 MED ORDER — CLONIDINE HCL 0.2 MG PO TABS
0.2000 mg | ORAL_TABLET | Freq: Once | ORAL | Status: AC
  Administered 2012-04-19: 0.2 mg via ORAL
  Filled 2012-04-19: qty 1

## 2012-04-19 MED ORDER — MORPHINE SULFATE 2 MG/ML IJ SOLN
2.0000 mg | INTRAMUSCULAR | Status: DC | PRN
  Administered 2012-04-20: 2 mg via INTRAVENOUS
  Filled 2012-04-19: qty 1

## 2012-04-19 MED ORDER — PENTAFLUOROPROP-TETRAFLUOROETH EX AERO: 1.0000 "application " | INHALATION_SPRAY | CUTANEOUS | Status: DC | PRN

## 2012-04-19 MED ORDER — SODIUM CHLORIDE 0.9 % IJ SOLN
3.0000 mL | Freq: Two times a day (BID) | INTRAMUSCULAR | Status: DC
  Administered 2012-04-20: 3 mL via INTRAVENOUS

## 2012-04-19 MED ORDER — SODIUM CHLORIDE 0.9 % IV SOLN: 250.0000 mL | INTRAVENOUS | Status: DC | PRN

## 2012-04-19 MED ORDER — ESCITALOPRAM OXALATE 10 MG PO TABS
10.0000 mg | ORAL_TABLET | Freq: Every evening | ORAL | Status: DC
  Administered 2012-04-20 – 2012-04-21 (×3): 10 mg via ORAL
  Filled 2012-04-19 (×6): qty 1

## 2012-04-19 MED ORDER — SODIUM CHLORIDE 0.9 % IV SOLN: 100.0000 mL | INTRAVENOUS | Status: DC | PRN

## 2012-04-19 MED ORDER — HEPARIN SODIUM (PORCINE) 1000 UNIT/ML DIALYSIS: 20.0000 [IU]/kg | INTRAMUSCULAR | Status: DC | PRN

## 2012-04-19 MED ORDER — ALTEPLASE 2 MG IJ SOLR
2.0000 mg | Freq: Once | INTRAMUSCULAR | Status: DC | PRN
  Filled 2012-04-19: qty 2

## 2012-04-19 MED ORDER — HEPARIN SODIUM (PORCINE) 5000 UNIT/ML IJ SOLN
5000.0000 [IU] | Freq: Three times a day (TID) | INTRAMUSCULAR | Status: DC
  Administered 2012-04-20 – 2012-04-21 (×5): 5000 [IU] via SUBCUTANEOUS
  Filled 2012-04-19 (×11): qty 1

## 2012-04-19 MED ORDER — LIDOCAINE HCL (PF) 1 % IJ SOLN: 5.0000 mL | INTRAMUSCULAR | Status: DC | PRN

## 2012-04-19 MED ORDER — NEPRO/CARBSTEADY PO LIQD
237.0000 mL | ORAL | Status: DC | PRN
  Filled 2012-04-19: qty 237

## 2012-04-19 MED ORDER — ALBUTEROL SULFATE (5 MG/ML) 0.5% IN NEBU: 2.5000 mg | INHALATION_SOLUTION | RESPIRATORY_TRACT | Status: DC | PRN

## 2012-04-19 MED ORDER — ACETAMINOPHEN 650 MG RE SUPP: 650.0000 mg | Freq: Four times a day (QID) | RECTAL | Status: DC | PRN

## 2012-04-19 MED ORDER — ONDANSETRON HCL 4 MG PO TABS: 4.0000 mg | ORAL_TABLET | Freq: Four times a day (QID) | ORAL | Status: DC | PRN

## 2012-04-19 MED ORDER — LABETALOL HCL 5 MG/ML IV SOLN
20.0000 mg | Freq: Once | INTRAVENOUS | Status: AC
  Administered 2012-04-19: 20 mg via INTRAVENOUS
  Filled 2012-04-19: qty 4

## 2012-04-19 MED ORDER — ALTEPLASE 2 MG IJ SOLR: 2.0000 mg | Freq: Once | INTRAMUSCULAR | Status: AC | PRN

## 2012-04-19 MED ORDER — CLONIDINE HCL 0.2 MG PO TABS
0.2000 mg | ORAL_TABLET | Freq: Two times a day (BID) | ORAL | Status: DC
  Administered 2012-04-20 (×3): 0.2 mg via ORAL
  Filled 2012-04-19 (×6): qty 1

## 2012-04-19 MED ORDER — ACETAMINOPHEN 325 MG PO TABS
650.0000 mg | ORAL_TABLET | Freq: Four times a day (QID) | ORAL | Status: DC | PRN
  Administered 2012-04-19 – 2012-04-21 (×5): 650 mg via ORAL
  Filled 2012-04-19 (×4): qty 2

## 2012-04-19 MED ORDER — OXYCODONE HCL 5 MG PO TABS
5.0000 mg | ORAL_TABLET | ORAL | Status: DC | PRN
  Administered 2012-04-20: 5 mg via ORAL
  Filled 2012-04-19: qty 1

## 2012-04-19 MED ORDER — SODIUM CHLORIDE 0.9 % IJ SOLN: 3.0000 mL | INTRAMUSCULAR | Status: DC | PRN

## 2012-04-19 MED ORDER — ONDANSETRON HCL 4 MG/2ML IJ SOLN
4.0000 mg | Freq: Four times a day (QID) | INTRAMUSCULAR | Status: DC | PRN
  Administered 2012-04-20 – 2012-04-21 (×3): 4 mg via INTRAVENOUS
  Filled 2012-04-19 (×3): qty 2

## 2012-04-19 MED ORDER — DARBEPOETIN ALFA-POLYSORBATE 100 MCG/0.5ML IJ SOLN
100.0000 ug | INTRAMUSCULAR | Status: DC
  Filled 2012-04-19: qty 0.5

## 2012-04-19 MED ORDER — CALCIUM ACETATE 667 MG PO CAPS
2001.0000 mg | ORAL_CAPSULE | Freq: Three times a day (TID) | ORAL | Status: DC
  Administered 2012-04-20 – 2012-04-22 (×6): 2001 mg via ORAL
  Filled 2012-04-19 (×10): qty 3

## 2012-04-19 MED ORDER — AMLODIPINE BESYLATE 10 MG PO TABS
10.0000 mg | ORAL_TABLET | Freq: Every day | ORAL | Status: DC
  Administered 2012-04-20 – 2012-04-22 (×3): 10 mg via ORAL
  Filled 2012-04-19 (×5): qty 1

## 2012-04-19 MED ORDER — ARIPIPRAZOLE 2 MG PO TABS
2.0000 mg | ORAL_TABLET | Freq: Every evening | ORAL | Status: DC
  Administered 2012-04-19 – 2012-04-21 (×3): 2 mg via ORAL
  Filled 2012-04-19 (×4): qty 1

## 2012-04-19 MED ORDER — SODIUM CHLORIDE 0.9 % IJ SOLN
3.0000 mL | Freq: Two times a day (BID) | INTRAMUSCULAR | Status: DC
  Administered 2012-04-20 – 2012-04-22 (×5): 3 mL via INTRAVENOUS

## 2012-04-19 MED ORDER — HYDRALAZINE HCL 25 MG PO TABS
25.0000 mg | ORAL_TABLET | Freq: Once | ORAL | Status: AC
  Administered 2012-04-20: 25 mg via ORAL
  Filled 2012-04-19 (×2): qty 1

## 2012-04-19 MED ORDER — LISINOPRIL 10 MG PO TABS
10.0000 mg | ORAL_TABLET | Freq: Every day | ORAL | Status: DC
  Administered 2012-04-20 – 2012-04-21 (×2): 10 mg via ORAL
  Filled 2012-04-19 (×2): qty 1

## 2012-04-19 MED ORDER — ASPIRIN EC 81 MG PO TBEC
162.0000 mg | DELAYED_RELEASE_TABLET | Freq: Every day | ORAL | Status: DC
  Administered 2012-04-20 – 2012-04-22 (×3): 162 mg via ORAL
  Filled 2012-04-19 (×3): qty 2

## 2012-04-19 MED ORDER — CINACALCET HCL 30 MG PO TABS
30.0000 mg | ORAL_TABLET | Freq: Every day | ORAL | Status: DC
  Administered 2012-04-20 – 2012-04-22 (×3): 30 mg via ORAL
  Filled 2012-04-19 (×4): qty 1

## 2012-04-19 MED ORDER — HYDROXYZINE HCL 25 MG PO TABS
25.0000 mg | ORAL_TABLET | Freq: Three times a day (TID) | ORAL | Status: DC | PRN
  Filled 2012-04-19: qty 1

## 2012-04-19 MED ORDER — LORAZEPAM 1 MG PO TABS
1.0000 mg | ORAL_TABLET | ORAL | Status: DC
  Administered 2012-04-19 – 2012-04-21 (×2): 1 mg via ORAL
  Filled 2012-04-19 (×2): qty 1

## 2012-04-19 NOTE — Procedures (Signed)
Patient was seen on dialysis and the procedure was supervised.  BFR 400  Via AVF BP is  244/114.   Patient appears to be tolerating treatment well  GOLDSBOROUGH,KELLIE A 04/19/2012

## 2012-04-19 NOTE — H&P (Signed)
PCP:   Dorrene German, MD   Chief Complaint:  SOB, high BP.   HPI: This is a 52 year old male, with known history of ESRD on HD M-W-F, HTN, s/p CVA, urolithiasis, referred to [facility], according to patient, from [facility] outpatient dialysis center at [facility]. He states that he went for his regular dialysis, BP was found to be very high, and HD was not done. On arrival in the ED, BP was 234/98, patient complained of chest tightness and anxiety. CXR showed findings consistent with fluid overload and diffuse interstitial edema. Patient claims to have had occasional cough with hemoptysis for about 3 weeks, without chest pain, fever or chill, and progressive bilateral LE edema for about 3 months.      Allergies:  No Known Allergies    Past Medical History  Diagnosis Date  . ESRD on dialysis   . Hypertension   . Stroke     States he had 3 strokes at once  . Sickle cell anemia   . Renal failure     Past Surgical History  Procedure Laterality Date  . Kidney stone surgery      Prior to Admission medications   Medication Sig Start Date End Date Taking? Authorizing Provider  amLODipine (NORVASC) 10 MG tablet Take 10 mg by mouth daily.   Yes Historical Provider, MD  ARIPiprazole (ABILIFY) 2 MG tablet Take 2 mg by mouth every evening.   Yes Historical Provider, MD  aspirin EC 81 MG tablet Take 162 mg by mouth daily.   Yes Historical Provider, MD  calcium acetate (PHOSLO) 667 MG capsule Take 667-2,001 mg by mouth 5 (five) times daily. 3 capsules (2001 mg) with meals and 1 capsule (667 mg) with snacks   Yes Historical Provider, MD  carvedilol (COREG) 25 MG tablet Take 25 mg by mouth 2 (two) times daily with a meal.   Yes Historical Provider, MD  cloNIDine (CATAPRES) 0.2 MG tablet Take 0.2 mg by mouth 2 (two) times daily.   Yes Historical Provider, MD  escitalopram (LEXAPRO) 10 MG tablet Take 10 mg by mouth every evening.   Yes Historical Provider, MD  HYDROcodone-acetaminophen  (NORCO/VICODIN) 5-325 MG per tablet Take 2 tablets by mouth every 6 (six) hours as needed for pain. 01/08/12  Yes Gerhard Munch, MD  hydrOXYzine (ATARAX/VISTARIL) 25 MG tablet Take 25 mg by mouth 3 (three) times daily as needed. For itching   Yes Historical Provider, MD  lisinopril (PRINIVIL,ZESTRIL) 10 MG tablet Take 10 mg by mouth daily.   Yes Historical Provider, MD  LORazepam (ATIVAN) 1 MG tablet Take 1 mg by mouth every Monday, Wednesday, and Friday. Monday Wednesday and Friday before hemodialysis.   Yes Historical Provider, MD    Social History: Patient reports that he has never smoked. He does not have any smokeless tobacco history on file. He reports that he does not drink alcohol or use illicit drugs.  Family History  Problem Relation Age of Onset  . Heart disease Mother   . Heart disease Father     Review of Systems:  As per HPI and chief complaint. Patent denies fatigue, diminished appetite, weight loss, fever, chills, he has occasional headache, but no blurred vision, difficulty in speaking, dyspahgia, chest pain, orthopnea, paroxysmal nocturnal dyspnea, nausea, diaphoresis, abdominal pain, vomiting, diarrhea, belching, heartburn, hematemesis, melena, dysuria, nocturia, urinary frequency, hematochezia, lower extremity pain, or redness. The rest of the systems review is negative.  Physical Exam:  General:  Patient does not appear  to be in obvious acute distress. Alert, somewhat anxious-looking, communicative, fully oriented, talking in complete sentences, not short of breath at rest.  HEENT:  Mild clinical pallor, no jaundice, no conjunctival injection or discharge. NECK:  Supple, JVP not seen, no carotid bruits, no palpable lymphadenopathy, no palpable goiter. CHEST:  Bilateral basal crackles, no wheeze. HEART:  Sounds 1 and 2 heard, normal, regular, bradycardic, no murmurs. ABDOMEN:  Full, soft, non-tender, no palpable organomegaly, no palpable masses, normal bowel  sounds. GENITALIA:  Not examined. LOWER EXTREMITIES:  Moderate pitting edema, palpable peripheral pulses. MUSCULOSKELETAL SYSTEM:  Unremarkable. CENTRAL NERVOUS SYSTEM:  No focal neurologic deficit on gross examination.  Labs on Admission:  Results for orders placed during the hospital encounter of 04/19/12 (from the past 48 hour(s))  PRO B NATRIURETIC PEPTIDE     Status: Abnormal   Collection Time    04/19/12  2:39 PM      Result Value Range   Pro B Natriuretic peptide (BNP) 37827.0 (*) 0 - 125 pg/mL  COMPREHENSIVE METABOLIC PANEL     Status: Abnormal   Collection Time    04/19/12  2:39 PM      Result Value Range   Sodium 139  135 - 145 mEq/L   Potassium 4.4  3.5 - 5.1 mEq/L   Chloride 97  96 - 112 mEq/L   CO2 25  19 - 32 mEq/L   Glucose, Bld 97  70 - 99 mg/dL   BUN 54 (*) 6 - 23 mg/dL   Creatinine, Ser 96.04 (*) 0.50 - 1.35 mg/dL   Calcium 8.5  8.4 - 54.0 mg/dL   Total Protein 6.9  6.0 - 8.3 g/dL   Albumin 3.6  3.5 - 5.2 g/dL   AST 6  0 - 37 U/L   ALT 8  0 - 53 U/L   Alkaline Phosphatase 87  39 - 117 U/L   Total Bilirubin 0.3  0.3 - 1.2 mg/dL   GFR calc non Af Amer 3 (*) >90 mL/min   GFR calc Af Amer 3 (*) >90 mL/min   Comment:            The eGFR has been calculated     using the CKD EPI equation.     This calculation has not been     validated in all clinical     situations.     eGFR's persistently     <90 mL/min signify     possible Chronic Kidney Disease.  CBC WITH DIFFERENTIAL     Status: Abnormal   Collection Time    04/19/12  2:39 PM      Result Value Range   WBC 8.5  4.0 - 10.5 K/uL   RBC 3.51 (*) 4.22 - 5.81 MIL/uL   Hemoglobin 10.5 (*) 13.0 - 17.0 g/dL   HCT 98.1 (*) 19.1 - 47.8 %   MCV 88.6  78.0 - 100.0 fL   MCH 29.9  26.0 - 34.0 pg   MCHC 33.8  30.0 - 36.0 g/dL   RDW 29.5  62.1 - 30.8 %   Platelets 292  150 - 400 K/uL   Neutrophils Relative 60  43 - 77 %   Neutro Abs 5.0  1.7 - 7.7 K/uL   Lymphocytes Relative 27  12 - 46 %   Lymphs Abs 2.2  0.7  - 4.0 K/uL   Monocytes Relative 6  3 - 12 %   Monocytes Absolute 0.5  0.1 - 1.0 K/uL  Eosinophils Relative 8 (*) 0 - 5 %   Eosinophils Absolute 0.6  0.0 - 0.7 K/uL   Basophils Relative 1  0 - 1 %   Basophils Absolute 0.0  0.0 - 0.1 K/uL  TROPONIN I     Status: None   Collection Time    04/19/12  2:47 PM      Result Value Range   Troponin I <0.30  <0.30 ng/mL   Comment:            Due to the release kinetics of cTnI,     a negative result within the first hours     of the onset of symptoms does not rule out     myocardial infarction with certainty.     If myocardial infarction is still suspected,     repeat the test at appropriate intervals.  POCT I-STAT TROPONIN I     Status: None   Collection Time    04/19/12  3:02 PM      Result Value Range   Troponin i, poc 0.06  0.00 - 0.08 ng/mL   Comment 3            Comment: Due to the release kinetics of cTnI,     a negative result within the first hours     of the onset of symptoms does not rule out     myocardial infarction with certainty.     If myocardial infarction is still suspected,     repeat the test at appropriate intervals.    Radiological Exams on Admission: Dg Chest 2 View  04/19/2012  *RADIOLOGY REPORT*  Clinical Data: Short of breath.  Dialysis patient.  CHEST - 2 VIEW  Comparison: 01/08/2012  Findings: Cardiac enlargement with vascular congestion and interstitial edema.  No significant pleural effusion.  IMPRESSION: Findings consistent with fluid overload and diffuse interstitial edema.   Original Report Authenticated By: Janeece Riggers, M.D.     Assessment/Plan Active Problems:    1. Pulmonary edema/Fluid overload: Patient presented with SOB, anxiety and chest tightness, as well as occasional hemoptysis. CXR confirmed fluid overload and diffuse interstitial edema. As patient is due for HD today, this is likely secondary to missed HD, although CHF, due to hypertensive heart disease, may be contributory. Will commence  ivi NTG and arrange HD, ASAP. Will monitor telemetrically, cycle cardiac enzymes and arrange 2D Echocardiogram.   2. ESRD: Patient is usually on HD M-W-F and had his scheduled HD on 04/16/12. Will consult renal team for HD, as he is now fluid overloaded.  3. HTN (hypertension), malignant: Patient according to him, has difficult-to-control HTN, and BP in ED is 234/98. As described above, will manage with ivi NTG, but expect response to HD. Will reinstate pre-admission antihypertensives, in due course.   Further management will depend on clinical course.  Comment: Patient is FULL CODE.   Time Spent on Admission: 45 mins.   OTI,CHRISTOPHER 04/19/2012, 4:53 PM

## 2012-04-19 NOTE — ED Notes (Signed)
Pt highly anxious.  States he would like ativan.

## 2012-04-19 NOTE — ED Notes (Signed)
States that he went to dialysis today and they would not do it because his bp was up. States had cp and his head hurt. States has dialysis in [place]. States has had high bp for 1 month and hjis dr has not adjusted his meds or anything. Pt came w/ papers from diaysis

## 2012-04-19 NOTE — ED Notes (Signed)
Pt was sent here with increased bp at dialysis center that was treated last Friday at HD for the same thing.  Pt denies headache or other symptoms besides sob

## 2012-04-19 NOTE — ED Notes (Signed)
Pt denying chest pain. Continues to c/o headache.  3+ non-pitting edema to both LE.  States cough, htn for 270's/100's x 1 month.

## 2012-04-19 NOTE — Consult Note (Signed)
Reason for Consult:  Malignant HTN and need for HD and also to manage dialysis related needs this hospitalization Referring Physician: Dr. Milas Gain is an 52 y.o. black male with PMHx significant for bipolar disorder and malignant HTN.  He is a patient of Dr. Louis Meckel in [facility], dialyses at the [facility] on Monday, Wednesday and Friday. Of note, he is under a behavioral contract there.    He says that he runs his full time and that he takes all his BP meds.  I see one flow sheet where he stayed his full 4 hours and 15 min, but his records from the dialysis unit also say that he does not always fill his BP meds on time.  He states and it is confirmed on the flow sheets that his BP has been very high for at least 2 weeks.  Today, when he presented, his pre tx BP was 250/100, thought to be too unstable for HD and told to go to the hospital.  Here, his BP was 234/98. He has been started on a NTG drip, his BNP was 37K, his CXR is consistent with pulm edema and he has 3 plus pitting edema to his lower extremities.  We will provide HD tonight and he will also need tomorrow in order to establish a new, more appropriate EDW.     Dialyzes at [facility] on MWF . Primary Nephrologist Dr. Louis Meckel. EDW 299 lbs/136 kg. HD Bath 2 K 2.5 ca, Heparin none but not sure why. Access AVF on left arm.  Past Medical History  Diagnosis Date  . ESRD on dialysis   . Hypertension   . Stroke     States he had 3 strokes at once  . Sickle cell anemia   . Renal failure     Past Surgical History  Procedure Laterality Date  . Kidney stone surgery      Family History  Problem Relation Age of Onset  . Heart disease Mother   . Heart disease Father     Social History:  reports that he has never smoked. He does not have any smokeless tobacco history on file. He reports that he does not drink alcohol or use illicit drugs.  Allergies: No Known Allergies  Medications: I have reviewed the patient's  current medications.   Hectorol 6 mcg Epogen 9300 units   Results for orders placed during the hospital encounter of 04/19/12 (from the past 48 hour(s))  PRO B NATRIURETIC PEPTIDE     Status: Abnormal   Collection Time    04/19/12  2:39 PM      Result Value Range   Pro B Natriuretic peptide (BNP) 37827.0 (*) 0 - 125 pg/mL  COMPREHENSIVE METABOLIC PANEL     Status: Abnormal   Collection Time    04/19/12  2:39 PM      Result Value Range   Sodium 139  135 - 145 mEq/L   Potassium 4.4  3.5 - 5.1 mEq/L   Chloride 97  96 - 112 mEq/L   CO2 25  19 - 32 mEq/L   Glucose, Bld 97  70 - 99 mg/dL   BUN 54 (*) 6 - 23 mg/dL   Creatinine, Ser 16.10 (*) 0.50 - 1.35 mg/dL   Calcium 8.5  8.4 - 96.0 mg/dL   Total Protein 6.9  6.0 - 8.3 g/dL   Albumin 3.6  3.5 - 5.2 g/dL   AST 6  0 - 37 U/L  ALT 8  0 - 53 U/L   Alkaline Phosphatase 87  39 - 117 U/L   Total Bilirubin 0.3  0.3 - 1.2 mg/dL   GFR calc non Af Amer 3 (*) >90 mL/min   GFR calc Af Amer 3 (*) >90 mL/min   Comment:            The eGFR has been calculated     using the CKD EPI equation.     This calculation has not been     validated in all clinical     situations.     eGFR's persistently     <90 mL/min signify     possible Chronic Kidney Disease.  CBC WITH DIFFERENTIAL     Status: Abnormal   Collection Time    04/19/12  2:39 PM      Result Value Range   WBC 8.5  4.0 - 10.5 K/uL   RBC 3.51 (*) 4.22 - 5.81 MIL/uL   Hemoglobin 10.5 (*) 13.0 - 17.0 g/dL   HCT 16.1 (*) 09.6 - 04.5 %   MCV 88.6  78.0 - 100.0 fL   MCH 29.9  26.0 - 34.0 pg   MCHC 33.8  30.0 - 36.0 g/dL   RDW 40.9  81.1 - 91.4 %   Platelets 292  150 - 400 K/uL   Neutrophils Relative 60  43 - 77 %   Neutro Abs 5.0  1.7 - 7.7 K/uL   Lymphocytes Relative 27  12 - 46 %   Lymphs Abs 2.2  0.7 - 4.0 K/uL   Monocytes Relative 6  3 - 12 %   Monocytes Absolute 0.5  0.1 - 1.0 K/uL   Eosinophils Relative 8 (*) 0 - 5 %   Eosinophils Absolute 0.6  0.0 - 0.7 K/uL   Basophils  Relative 1  0 - 1 %   Basophils Absolute 0.0  0.0 - 0.1 K/uL  TROPONIN I     Status: None   Collection Time    04/19/12  2:47 PM      Result Value Range   Troponin I <0.30  <0.30 ng/mL   Comment:            Due to the release kinetics of cTnI,     a negative result within the first hours     of the onset of symptoms does not rule out     myocardial infarction with certainty.     If myocardial infarction is still suspected,     repeat the test at appropriate intervals.  POCT I-STAT TROPONIN I     Status: None   Collection Time    04/19/12  3:02 PM      Result Value Range   Troponin i, poc 0.06  0.00 - 0.08 ng/mL   Comment 3            Comment: Due to the release kinetics of cTnI,     a negative result within the first hours     of the onset of symptoms does not rule out     myocardial infarction with certainty.     If myocardial infarction is still suspected,     repeat the test at appropriate intervals.    Dg Chest 2 View  04/19/2012  *RADIOLOGY REPORT*  Clinical Data: Short of breath.  Dialysis patient.  CHEST - 2 VIEW  Comparison: 01/08/2012  Findings: Cardiac enlargement with vascular congestion and interstitial edema.  No significant pleural effusion.  IMPRESSION: Findings consistent with fluid overload and diffuse interstitial edema.   Original Report Authenticated By: Janeece Riggers, M.D.     ROS: Patient really denies compliant, he says his BP has been high for weeks. ROS negative for nausea/vomiting/diarrhea/constipation Denies headaches/vision changes, fever/dhills/NS he DOES have cough and some SOB , LE edema.  The remainder of the ROS is negative Blood pressure 206/90, pulse 52, temperature 97.6 F (36.4 C), resp. rate 22, SpO2 99.00%. General appearance: alert, distracted and mild distress Eyes: conjunctivae/corneas clear. PERRL, EOM's intact. Fundi benign. Neck: JVD - 3 cm above sternal notch, no adenopathy, no carotid bruit, supple, symmetrical, trachea midline and  thyroid not enlarged, symmetric, no tenderness/mass/nodules Resp: diminished breath sounds bibasilar Cardio: regular rate and rhythm GI: soft, non-tender; bowel sounds normal; no masses,  no organomegaly Extremities: edema 3 plus pitting  L arm AV access with buttonholes, good thrill and bruit  Assessment/Plan:52 year old BM with ESRD, now presents with malignant HTN and volume overload.  1 HTN- Mostly due to his volume status.  It appears that he needs new lower EDW established.  Medication noncompliance seems to be an issue as well.  I imagine his behavior makes it difficult to care for him properly.  Will plan for HD tonight, remove 6 liters as able and plan for HD tomorrow as well. His home meds for BP include clonidine 0.2 TID/amlodipine 10 qd/coreg 25 BID and lisinopril 20 qD 2 ESRD: normally MWF at [facility].  Today is his day, likely will be done Tuesday and Wednesday as well to get volume status under control.   3. Anemia of ESRD: reasonable hgb above 10, will continue equivalent of home dose ESA 4. Metabolic Bone Disease: continue hectorol/phoslo.  He is supposed to be on sensipar as well.    Thank you for this consult , we will follow with you   GOLDSBOROUGH,KELLIE A 04/19/2012, 6:03 PM

## 2012-04-19 NOTE — ED Notes (Signed)
PT becoming aggravated, stating he can't breathe.  SP02 of 100% on 2 L.  Pt thinks he is having a panic attack.  To notify Dr Elise Benne.

## 2012-04-20 DIAGNOSIS — R109 Unspecified abdominal pain: Secondary | ICD-10-CM

## 2012-04-20 DIAGNOSIS — F3112 Bipolar disorder, current episode manic without psychotic features, moderate: Secondary | ICD-10-CM

## 2012-04-20 LAB — CBC
HCT: 32.6 % — ABNORMAL LOW (ref 39.0–52.0)
MCHC: 33.7 g/dL (ref 30.0–36.0)
RDW: 14.9 % (ref 11.5–15.5)

## 2012-04-20 LAB — TROPONIN I
Troponin I: <0.3 ng/mL (ref <0.30)
Troponin I: <0.3 ng/mL (ref <0.30)

## 2012-04-20 LAB — RENAL FUNCTION PANEL
Albumin: 3.6 g/dL (ref 3.5–5.2)
Chloride: 95 mEq/L — ABNORMAL LOW (ref 96–112)
GFR calc Af Amer: 5 mL/min — ABNORMAL LOW (ref >90)
Glucose, Bld: 125 mg/dL — ABNORMAL HIGH (ref 70–99)
Phosphorus: 7.3 mg/dL — ABNORMAL HIGH (ref 2.3–4.6)
Potassium: 4.2 mEq/L (ref 3.5–5.1)
Sodium: 136 mEq/L (ref 135–145)

## 2012-04-20 MED ORDER — HYDRALAZINE HCL 20 MG/ML IJ SOLN
10.0000 mg | Freq: Once | INTRAMUSCULAR | Status: AC
  Administered 2012-04-20: 10 mg via INTRAVENOUS
  Filled 2012-04-20: qty 0.5

## 2012-04-20 MED ORDER — ACETAMINOPHEN 325 MG PO TABS
ORAL_TABLET | ORAL | Status: AC
  Administered 2012-04-20: 650 mg via ORAL
  Filled 2012-04-20: qty 2

## 2012-04-20 MED ORDER — HYDRALAZINE HCL 20 MG/ML IJ SOLN
20.0000 mg | Freq: Once | INTRAMUSCULAR | Status: AC
  Administered 2012-04-20: 20 mg via INTRAVENOUS
  Filled 2012-04-20: qty 1

## 2012-04-20 MED ORDER — GELATIN ABSORBABLE 12-7 MM EX MISC
1.0000 | Freq: Once | CUTANEOUS | Status: DC
  Filled 2012-04-20: qty 1

## 2012-04-20 NOTE — Progress Notes (Signed)
Patient ID: Gabriel Bell, male   DOB: 1960-07-30, 52 y.o.   MRN: [id number]  [facility] PROGRESS NOTE  TUVIA WOODRICK JYN:[id number] DOB: [birth year] DOA: 04/19/2012 PCP: Dorrene German, MD  Brief narrative: Pt is 52 year old male, with known history of ESRD on HD M-W-F, HTN, s/p CVA, urolithiasis, referred to [facility], according to patient, from [facility] outpatient dialysis center at [facility]. He states that he went for his regular dialysis, BP was found to be very high, and HD was not done. On arrival in the ED, BP was 234/98, patient complained of chest tightness and anxiety. CXR showed findings consistent with fluid overload and diffuse interstitial edema. Patient claims to have had occasional cough with hemoptysis for about 3 weeks, without chest pain, fever or chills, and progressive bilateral LE edema for about 3 months.   Active Problems:   Pulmonary edema - secondary to volume overload in the setting of ESRD and medical noncompliance - HD per nephrology team, appreciate assistance - will likely need HD again in AM - today's session on HD total of 6 L removed   HTN (hypertension), malignant - secondary to volume status - slightly improved after HD - plan on HD again in Am per renal   Anemia of chronic kidney disease - Hg and Hct stable and at pt's baseline - CBC In AM   Consultants:  Nephrology  Procedures/Studies: Dg Chest 2 View 04/19/2012 -->  Findings consistent with fluid overload and diffuse interstitial edema.   Antibiotics:  None  Code Status: Full Family Communication: Pt at bedside, no other family member present  Disposition Plan: Home when medically stable  HPI/Subjective: No events overnight.   Objective: Filed Vitals:   04/20/12 1430 04/20/12 1500 04/20/12 1530 04/20/12 1600  BP: 242/125 238/112 235/120 264/134  Pulse: 52 54 54 56  Temp:      TempSrc:      Resp:      Height:      Weight:      SpO2: 91%       Intake/Output Summary  (Last 24 hours) at 04/20/12 1635 Last data filed at 04/20/12 1245  Gross per 24 hour  Intake    480 ml  Output   5200 ml  Net  -4720 ml    Exam:   General:  Pt is alert, follows commands appropriately, not in acute distress  Cardiovascular: Regular rate and rhythm, S1/S2, no murmurs, no rubs, no gallops  Respiratory: Bilateral crackles, decreased air movement bilaterally   Abdomen: Soft, non tender, non distended, bowel sounds present, no guarding  Extremities: +3 bilateral pitting LE edema, pulses DP and PT palpable bilaterally  Neuro: Grossly nonfocal  Data Reviewed: Basic Metabolic Panel:  Recent Labs Lab 04/19/12 1439 04/20/12 1400  NA 139 136  K 4.4 4.2  CL 97 95*  CO2 25 27  GLUCOSE 97 125*  BUN 54* 32*  CREATININE 15.99* 11.15*  CALCIUM 8.5 9.1  PHOS  --  7.3*   Liver Function Tests:  Recent Labs Lab 04/19/12 1439 04/20/12 1400  AST 6  --   ALT 8  --   ALKPHOS 87  --   BILITOT 0.3  --   PROT 6.9  --   ALBUMIN 3.6 3.6   CBC:  Recent Labs Lab 04/19/12 1439 04/20/12 0740  WBC 8.5 8.5  NEUTROABS 5.0  --   HGB 10.5* 11.0*  HCT 31.1* 32.6*  MCV 88.6 86.9  PLT 292 307  Cardiac Enzymes:  Recent Labs Lab 04/19/12 1447 04/19/12 2314 04/20/12 0740  TROPONINI <0.30 <0.30 <0.30   Scheduled Meds: . amLODipine  10 mg Oral Daily  . ARIPiprazole  2 mg Oral QPM  . aspirin EC  162 mg Oral Daily  . calcium acetate  2,001 mg Oral TID WC  . carvedilol  25 mg Oral BID WC  . cinacalcet  30 mg Oral Q breakfast  . cloNIDine  0.2 mg Oral BID  . [START ON 04/21/2012] darbepoetin (ARANESP) injection - DIALYSIS  100 mcg Intravenous Q Wed-HD  . [START ON 04/21/2012] doxercalciferol  6 mcg Intravenous Q M,W,F-HD  . escitalopram  10 mg Oral QPM  . heparin  5,000 Units Subcutaneous Q8H  . lisinopril  10 mg Oral Daily  . LORazepam  1 mg Oral Q M,W,F   Continuous Infusions:    Debbora Presto, MD  [facility] Pager [phone]  If 7PM-7AM, please contact  night-coverage [email or web address] Password [facility] 04/20/2012, 4:35 PM   LOS: 1 day

## 2012-04-20 NOTE — ED Provider Notes (Signed)
History     [name]: [id number]  Arrival date & time 04/19/12  1317   First MD Initiated Contact with Patient 04/19/12 1401      Chief Complaint  Patient presents with  . Hypertension  . Shortness of Breath    (Consider location/radiation/quality/duration/timing/severity/associated sxs/prior treatment) HPI Comments: 52 year old male, with known history of ESRD on HD M-W-F, HTN, s/p CVA, urolithiasis, referred to [facility], according to patient, from [facility] outpatient dialysis center at [facility]. He states that he went for his regular dialysis, BP was found to be very high, and HD was not done. On arrival in the ED, BP was 234/98, patient complained of chest tightness, DIB. He has no headache, n/v/f/c/visual complains, AMS, seizures. Pt has been taking his meds as prescribed and states that his pressure has been running this hight for at least the last 2 months.  Patient is a 52 y.o. male presenting with hypertension and shortness of breath. The history is provided by the patient and medical records.  Hypertension Associated symptoms include chest pain and shortness of breath. Pertinent negatives include no headaches.  Shortness of Breath Associated symptoms: chest pain   Associated symptoms: no cough, no fever, no headaches and no neck pain     Past Medical History  Diagnosis Date  . ESRD on dialysis   . Hypertension   . Stroke     States he had 3 strokes at once  . Sickle cell anemia   . Renal failure     Past Surgical History  Procedure Laterality Date  . Kidney stone surgery      Family History  Problem Relation Age of Onset  . Heart disease Mother   . Heart disease Father     History  Substance Use Topics  . Smoking status: Never Smoker   . Smokeless tobacco: Not on file  . Alcohol Use: No      Review of Systems  Constitutional: Negative for fever, chills and activity change.  HENT: Negative for neck pain.   Eyes: Negative for visual disturbance.   Respiratory: Positive for chest tightness and shortness of breath. Negative for cough.   Cardiovascular: Positive for chest pain.  Gastrointestinal: Negative for abdominal distention.  Genitourinary: Negative for dysuria, enuresis and difficulty urinating.  Musculoskeletal: Negative for arthralgias.  Neurological: Negative for dizziness, light-headedness and headaches.  Psychiatric/Behavioral: Negative for confusion.    Allergies  Review of patient's allergies indicates no known allergies.  Home Medications  No current outpatient prescriptions on file.  BP 225/92  Pulse 54  Temp(Src) 98.3 F (36.8 C) (Oral)  Resp 22  Ht 5\' 10"  (1.778 m)  Wt 289 lb 3.2 oz (131.18 kg)  BMI 41.5 kg/m2  SpO2 99%  Physical Exam  Nursing note and vitals reviewed. Constitutional: He is oriented to person, place, and time. He appears well-developed.  HENT:  Head: Normocephalic and atraumatic.  Eyes: Conjunctivae and EOM are normal. Pupils are equal, round, and reactive to light.  Neck: Normal range of motion. Neck supple.  Cardiovascular: Normal rate and regular rhythm.   Murmur heard. Pulmonary/Chest: Effort normal. He has rales.  Abdominal: Soft. Bowel sounds are normal. He exhibits no distension. There is no tenderness. There is no rebound and no guarding.  Musculoskeletal: He exhibits edema.  Neurological: He is alert and oriented to person, place, and time. No cranial nerve deficit. Coordination normal.  Skin: Skin is warm.    ED Course  Procedures (including critical care time)  Labs Reviewed  PRO B NATRIURETIC PEPTIDE - Abnormal; Notable for the following:    Pro B Natriuretic peptide (BNP) 37827.0 (*)    All other components within normal limits  COMPREHENSIVE METABOLIC PANEL - Abnormal; Notable for the following:    BUN 54 (*)    Creatinine, Ser 15.99 (*)    GFR calc non Af Amer 3 (*)    GFR calc Af Amer 3 (*)    All other components within normal limits  CBC WITH DIFFERENTIAL  - Abnormal; Notable for the following:    RBC 3.51 (*)    Hemoglobin 10.5 (*)    HCT 31.1 (*)    Eosinophils Relative 8 (*)    All other components within normal limits  TROPONIN I  TROPONIN I  HEPATITIS B SURFACE ANTIGEN  TROPONIN I  CBC  POCT I-STAT TROPONIN I   Dg Chest 2 View  04/19/2012  *RADIOLOGY REPORT*  Clinical Data: Short of breath.  Dialysis patient.  CHEST - 2 VIEW  Comparison: 01/08/2012  Findings: Cardiac enlargement with vascular congestion and interstitial edema.  No significant pleural effusion.  IMPRESSION: Findings consistent with fluid overload and diffuse interstitial edema.   Original Report Authenticated By: Janeece Riggers, M.D.      1. Pulmonary edema   2. Chest pain   3. ESRD (end stage renal disease) on dialysis   4. HTN (hypertension), malignant   5. Hypertensive emergency   6. ESRD on dialysis       MDM   Date: 04/20/2012  Rate: 58  Rhythm: normal sinus rhythm, 1st degree av block  QRS Axis: normal  Intervals: normal  ST/T Wave abnormalities: normal, non specific changes  Conduction Disutrbances: none  Narrative Interpretation: unremarkable  Pt comes in with cc of elevated pressures. He has some chest tightness, he missed his ekg and is slightly tachyppeic.  I think the tachypnea from the pulm edema is due to missed dialysis and fluid overload, and likely not related to the HTN. We will get cardiac labs. EKG is showing no acute findings right now.  Pt is due for his clonidine, and it will be a fair idea to give him his home dose along with some iv labetalol. End point is likely dialysis for the BP as well.  Will admit.     Derwood Kaplan, MD 04/20/12 0730

## 2012-04-20 NOTE — Procedures (Signed)
Pt seen on HD.  Breathing better.  CO HA from nitro.  He is clearly volume overloaded which is driving his high BP.  To pull 6 liters today.  Will decide in AM if needs more HD.  DC nitro gtt.

## 2012-04-21 DIAGNOSIS — N039 Chronic nephritic syndrome with unspecified morphologic changes: Secondary | ICD-10-CM

## 2012-04-21 DIAGNOSIS — I161 Hypertensive emergency: Secondary | ICD-10-CM | POA: Diagnosis present

## 2012-04-21 DIAGNOSIS — N189 Chronic kidney disease, unspecified: Secondary | ICD-10-CM

## 2012-04-21 DIAGNOSIS — I10 Essential (primary) hypertension: Secondary | ICD-10-CM

## 2012-04-21 DIAGNOSIS — R51 Headache: Secondary | ICD-10-CM

## 2012-04-21 DIAGNOSIS — D631 Anemia in chronic kidney disease: Secondary | ICD-10-CM

## 2012-04-21 DIAGNOSIS — I517 Cardiomegaly: Secondary | ICD-10-CM

## 2012-04-21 LAB — RENAL FUNCTION PANEL
Albumin: 3.8 g/dL (ref 3.5–5.2)
BUN: 16 mg/dL (ref 6–23)
CO2: 29 mEq/L (ref 19–32)
Calcium: 9.1 mg/dL (ref 8.4–10.5)
Chloride: 97 mEq/L (ref 96–112)
Creatinine, Ser: 7.7 mg/dL — ABNORMAL HIGH (ref 0.50–1.35)
GFR calc Af Amer: 8 mL/min — ABNORMAL LOW (ref >90)
GFR calc non Af Amer: 7 mL/min — ABNORMAL LOW (ref >90)
Glucose, Bld: 113 mg/dL — ABNORMAL HIGH (ref 70–99)
Phosphorus: 5.2 mg/dL — ABNORMAL HIGH (ref 2.3–4.6)
Potassium: 3.8 mEq/L (ref 3.5–5.1)
Sodium: 140 mEq/L (ref 135–145)

## 2012-04-21 LAB — CBC
HCT: 32.7 % — ABNORMAL LOW (ref 39.0–52.0)
Hemoglobin: 10.9 g/dL — ABNORMAL LOW (ref 13.0–17.0)
MCH: 29.1 pg (ref 26.0–34.0)
MCHC: 33.3 g/dL (ref 30.0–36.0)
MCV: 87.2 fL (ref 78.0–100.0)
Platelets: 301 10*3/uL (ref 150–400)
RBC: 3.75 MIL/uL — ABNORMAL LOW (ref 4.22–5.81)
RDW: 15.2 % (ref 11.5–15.5)
WBC: 7.4 10*3/uL (ref 4.0–10.5)

## 2012-04-21 MED ORDER — CLONIDINE HCL 0.2 MG PO TABS
0.2000 mg | ORAL_TABLET | Freq: Three times a day (TID) | ORAL | Status: DC
  Administered 2012-04-21: 0.2 mg via ORAL
  Filled 2012-04-21 (×6): qty 1

## 2012-04-21 MED ORDER — ACETAMINOPHEN 325 MG PO TABS
ORAL_TABLET | ORAL | Status: AC
  Filled 2012-04-21: qty 2

## 2012-04-21 MED ORDER — PROMETHAZINE HCL 25 MG/ML IJ SOLN
12.5000 mg | Freq: Once | INTRAMUSCULAR | Status: AC
  Administered 2012-04-21: 12.5 mg via INTRAVENOUS
  Filled 2012-04-21: qty 1

## 2012-04-21 MED ORDER — DARBEPOETIN ALFA-POLYSORBATE 100 MCG/0.5ML IJ SOLN
INTRAMUSCULAR | Status: AC
  Administered 2012-04-21: 100 ug via INTRAVENOUS
  Filled 2012-04-21: qty 0.5

## 2012-04-21 MED ORDER — LISINOPRIL 20 MG PO TABS
20.0000 mg | ORAL_TABLET | Freq: Every day | ORAL | Status: DC
  Administered 2012-04-22: 20 mg via ORAL
  Filled 2012-04-21: qty 1

## 2012-04-21 MED ORDER — HYDRALAZINE HCL 20 MG/ML IJ SOLN
20.0000 mg | INTRAMUSCULAR | Status: DC | PRN
  Administered 2012-04-21 (×2): 20 mg via INTRAVENOUS
  Filled 2012-04-21 (×2): qty 1

## 2012-04-21 MED ORDER — CLONIDINE HCL 0.2 MG PO TABS
0.2000 mg | ORAL_TABLET | ORAL | Status: AC
  Administered 2012-04-21: 0.2 mg via ORAL
  Filled 2012-04-21: qty 1

## 2012-04-21 MED ORDER — LABETALOL HCL 200 MG PO TABS
200.0000 mg | ORAL_TABLET | Freq: Two times a day (BID) | ORAL | Status: DC
  Administered 2012-04-21 – 2012-04-22 (×2): 200 mg via ORAL
  Filled 2012-04-21 (×4): qty 1

## 2012-04-21 MED ORDER — DOXERCALCIFEROL 4 MCG/2ML IV SOLN
INTRAVENOUS | Status: AC
  Administered 2012-04-21: 4 ug via INTRAVENOUS
  Filled 2012-04-21: qty 4

## 2012-04-21 NOTE — Progress Notes (Signed)
Event: Notified by RN that pt c/o nausea and severe h/a. Has had persistent HTN that has not responded to multiple interventions. Pt has until now been asymptomatic. BP currently 260/140 associated w/ dizziness, nausea (unrelieved by Zofran and Phenergan) and h/a. Rapid Response RN has been paged and is currently at bedside. NP to bedside. Subjective: Pt reporting h/a though somewhat improved persist. Also c/o intermittent nausea and dizziness. He admits that symptoms are new for him. States he feels terrible. Denies CP or SOB.  Objective: Gabriel Bell is a 58 y/ A.A male who was admitted on 04/19/2012 after being sent to ED by staff at [facility] due tp BP of 234/98. Since admission SBP has remained persistently > 200 and refractory to NTG drip, daily meds of Norvasc, Clonidine, Labetalol and Lisinopril as well as PRN hydralazine. NTG qtt d/c'd by nephrology yesterday (04/20/2012) and BP's have remained significantly elevated. There has been minimal improvement in BP's after HD x 2. Until now pt has been asymptomatic. At bedside pt currently appears mildly distressed. BP- 250/120, T-98, P-72, R-22 w/ 02 sats of 93% on r/a. BBS CTA.  Assessment/Plan: 1. Malignant Hypertension: Refractory to multiple interventions including HD x 2, NTG qtt and multiple scheduled antihypertensive meds. H/o of HTN that has been difficult to manage as well as a component of non-compliance. Pt now symptomatic w/ c/o h/a, dizziness and nausea. Discussed pt w/ Gabriel Bell. Will give Clonidine 0.2mg  now and transfer to ICU for possible Cardene qtt. I have discussed pt w/ Gabriel Bell w/ [facility] who has agreed to have Gabriel Bell see pt in ICU. Appreciate CCM input. Will continue to monitor closely.  Leanne Chang, NP-C [facility] Pager [phone]

## 2012-04-21 NOTE — Progress Notes (Signed)
Pt. C/o of N/V and dizziness. Pt. Manual BP 240/102. Floor coverage, Merdis Delay, NP made aware. Order to give Hydralazine given and implemented. PRN Zofran also administered. RN reassessed pt. Pt. Actively vomiting. Pt. Stated Zofran not working and also c/o of headache. Schorr, paged, order for Phenergan received and implemented as ordered. PRN Tylenol given.  Pt. Stated the headache relieved by Tylenol. BP reassessed 250/110. Rapid Response RN called to assess pt. K. Schorr on the floor to assess pt. Orders to transfer pt. To 3100 received and implemented. Report called to 3100 RN about pts. Current status. Wall, Cheryll Dessert

## 2012-04-21 NOTE — Progress Notes (Signed)
Bedside rounds with RN  BP sbp 176. MAP 94, HR 59. Improved with labetalol prn. No headache  Will change to sdu status dw [facility] Dr Waymon Amato - [facility] will follow. PCCM to sign off    Dr. Kalman Shan, M.D., [facility].C.P Pulmonary and Critical Care Medicine Staff Physician [facility] Pager: [phone], If no answer or between  15:00h - 7:00h: call [phone]  04/21/2012 9:12 AM

## 2012-04-21 NOTE — Consult Note (Signed)
PULMONARY  / CRITICAL CARE MEDICINE  Name: Gabriel Bell MRN: [id number] DOB: September 17, 1960    ADMISSION DATE:  04/19/2012 CONSULTATION DATE:  04/19/2012  REFERRING MD :  [facility]  CHIEF COMPLAINT:  Hypertensive emergency  BRIEF PATIENT DESCRIPTION:  52 yo with ESRD on HD and poorly controlled HTN admitted 3/3 for fluid overload, acute pulmonary edema and hypertension.  Transferred to ICU after having BP 250 / 110 associated with headache and not responding to treatment.  SIGNIFICANT EVENTS / STUDIES:  3/3  Admitted with fluid overload, acute pulmonary edema and HTN 3/5  Transferred to ICU with hypertensive emergency  LINES / TUBES:  CULTURES:  ANTIBIOTICS:  HISTORY OF PRESENT ILLNESS:  52 yo with ESRD on HD and poorly controlled HTN admitted 3/3 for fluid overload, acute pulmonary edema and hypertension.  Transferred to ICU after having BP 250 / 110 associated with headache and not responding to treatment.  Currently reports mild headache but no nausea / vomiting.  Denies dyspnea.  PAST MEDICAL HISTORY :  Past Medical History  Diagnosis Date  . ESRD on dialysis   . Hypertension   . Stroke     States he had 3 strokes at once  . Sickle cell anemia   . Renal failure    Past Surgical History  Procedure Laterality Date  . Kidney stone surgery     Prior to Admission medications   Medication Sig Start Date End Date Taking? Authorizing Provider  amLODipine (NORVASC) 10 MG tablet Take 10 mg by mouth daily.   Yes Historical Provider, MD  ARIPiprazole (ABILIFY) 2 MG tablet Take 2 mg by mouth every evening.   Yes Historical Provider, MD  aspirin EC 81 MG tablet Take 162 mg by mouth daily.   Yes Historical Provider, MD  calcium acetate (PHOSLO) 667 MG capsule Take 667-2,001 mg by mouth 5 (five) times daily. 3 capsules (2001 mg) with meals and 1 capsule (667 mg) with snacks   Yes Historical Provider, MD  carvedilol (COREG) 25 MG tablet Take 25 mg by mouth 2 (two) times daily with a meal.   Yes  Historical Provider, MD  cloNIDine (CATAPRES) 0.2 MG tablet Take 0.2 mg by mouth 2 (two) times daily.   Yes Historical Provider, MD  escitalopram (LEXAPRO) 10 MG tablet Take 10 mg by mouth every evening.   Yes Historical Provider, MD  HYDROcodone-acetaminophen (NORCO/VICODIN) 5-325 MG per tablet Take 2 tablets by mouth every 6 (six) hours as needed for pain. 01/08/12  Yes Gerhard Munch, MD  hydrOXYzine (ATARAX/VISTARIL) 25 MG tablet Take 25 mg by mouth 3 (three) times daily as needed. For itching   Yes Historical Provider, MD  lisinopril (PRINIVIL,ZESTRIL) 10 MG tablet Take 10 mg by mouth daily.   Yes Historical Provider, MD  LORazepam (ATIVAN) 1 MG tablet Take 1 mg by mouth every Monday, Wednesday, and Friday. Monday Wednesday and Friday before hemodialysis.   Yes Historical Provider, MD   No Known Allergies  FAMILY HISTORY:  Family History  Problem Relation Age of Onset  . Heart disease Mother   . Heart disease Father    SOCIAL HISTORY:  reports that he has never smoked. He does not have any smokeless tobacco history on file. He reports that he does not drink alcohol or use illicit drugs.  REVIEW OF SYSTEMS:  12 point negative, except as in HPI.  INTERVAL HISTORY:  VITAL SIGNS: Temp:  [97.5 F (36.4 C)-98.3 F (36.8 C)] 98 F (36.7 C) (03/05 0316) Pulse Rate:  [  51-73] 73 (03/05 0316) Resp:  [18-26] 21 (03/05 0316) BP: (203-270)/(81-135) 203/82 mmHg (03/05 0350) SpO2:  [91 %-99 %] 95 % (03/05 0316) Weight:  [125.8 kg (277 lb 5.4 oz)-131.5 kg (289 lb 14.5 oz)] 127 kg (279 lb 15.8 oz) (03/05 0316) HEMODYNAMICS:   VENTILATOR SETTINGS:   INTAKE / OUTPUT: Intake/Output     03/04 0701 - 03/05 0700   P.O. 720   Total Intake(mL/kg) 720 (5.7)   Other 5145   Total Output 5145   Net -4425       Emesis Occurrence 2 x     PHYSICAL EXAMINATION: General:  Comfortable, no distress Neuro:  Awake, alert, cooperative, nonfocal HEENT:  PERRL Cardiovascular:  RRR, no m/r/g Lungs:   Bilateral diminished air entry, no w/r/r Abdomen:  Soft, nontender, bowel sounds diminished Musculoskeletal:  Moves all extremities, trace edema Skin:  Intact  LABS:  Recent Labs Lab 04/19/12 1439 04/19/12 1447 04/19/12 2314 04/20/12 0740 04/20/12 1400  HGB 10.5*  --   --  11.0*  --   WBC 8.5  --   --  8.5  --   PLT 292  --   --  307  --   NA 139  --   --   --  136  K 4.4  --   --   --  4.2  CL 97  --   --   --  95*  CO2 25  --   --   --  27  GLUCOSE 97  --   --   --  125*  BUN 54*  --   --   --  32*  CREATININE 15.99*  --   --   --  11.15*  CALCIUM 8.5  --   --   --  9.1  PHOS  --   --   --   --  7.3*  AST 6  --   --   --   --   ALT 8  --   --   --   --   ALKPHOS 87  --   --   --   --   BILITOT 0.3  --   --   --   --   PROT 6.9  --   --   --   --   ALBUMIN 3.6  --   --   --  3.6  TROPONINI  --  <0.30 <0.30 <0.30  --   PROBNP 37827.0*  --   --   --   --    No results found for this basename: GLUCAP,  in the last 168 hours  CXR:    ASSESSMENT / PLAN:  PULMONARY A:  No active issues. P:   No intervention required  CARDIOVASCULAR A: Hypertensive emergency. P:  Goal SBP<190, DBP<110 D/c Coreg Start Labetalol 200 bid, may escalate PRN Increase Clonidine 0.2 to q8h to avoid rebound Continue Norvasc, Lisinopril Hydralazine PRN ASA 2D echo pending  RENAL A:  ESRD on HD P:   Per Renal  GASTROINTESTINAL A:  Nausea / vomiting, likely secondary to hypertensive emergency. P:   Phenergan PRN Renal diet  HEMATOLOGIC A:  Anemia of renal disease. P:  Trend CBC Heparin for DVT Px Aranesp  INFECTIOUS A:  No active issues. P:   No intervention required  ENDOCRINE  A:  No active issues. P:   No intervention required  NEUROLOGIC A:  Headache secondary to hypertensive emergency. P:   As above Abilify,  Lexapro, Oxycodone, Ativan as preadmission  TODAY'S SUMMARY: 52 yo with ESRD on HD and poorly controlled HTN admitted 3/3 for fluid overload, acute  pulmonary edema and hypertension.  Transferred to ICU after having BP 250 / 110 associated with headache and not responding to treatment.  BP regimen adjusted.  Downgrade later if stable.   I have personally obtained a history, examined the patient, evaluated laboratory and imaging results, formulated the assessment and plan and placed orders.  Lonia Farber, MD Pulmonary and Critical Care Medicine [facility] Pager: [phone]  04/21/2012, 3:55 AM

## 2012-04-21 NOTE — Progress Notes (Signed)
Patient ID: Gabriel Bell, male   DOB: [birth year], 52 y.o.   MRN: [id number]  [facility] PROGRESS NOTE  Gabriel Bell JYN:[id number] DOB: [birth year] DOA: 04/19/2012 PCP: Dorrene German, MD  Brief narrative: Pt is 52 year old male, with known history of ESRD on HD M-W-F, malignant HTN, bipolar disorder, s/p CVA, urolithiasis, possible medication noncompliance, referred to [facility], according to patient, from [facility] outpatient dialysis center at [facility]. He states that he went for his regular dialysis, BP was found to be very high, and HD was not done. On arrival in the ED, BP was 234/98, patient complained of chest tightness and anxiety. CXR showed findings consistent with fluid overload and diffuse interstitial edema. Patient claims to have had occasional cough with hemoptysis for about 3 weeks, without chest pain, fever or chills, and progressive bilateral LE edema for about 3 months.   Active Problems: 1. Acute on chronic diastolic CHF/Pulmonary edema: secondary to volume overload in the setting of ESRD and medical noncompliance. HD per nephrology team, appreciate assistance. Has been dialyzed on 3/3 and 3/4. Nephrology will follow today to decide HD needs for today. Echo 04/11/11: Mild LVH and LVEF 55-60%. 2. Hypertensive emergency/HTN (hypertension), malignant: secondary to volume status. Patient was transferred to ICU overnight 3/5 secondary to headache and nausea in the context of markedly elevated blood pressures. Blood pressure control has improved. CCM signed off. Continue amlodipine 10 mg daily, lisinopril 10 mg daily, labetalol 200 mg twice a day and clonidine 0.2 mg by mouth 3 times a day and when necessary hydralazine.? Consider minoxidil. Should also improve with further HD. 3. Anemia of chronic kidney disease: Stable 4. ESRD: Nephrology following. HD per nephrology 5. History of bipolar disorder: Continue Abilify.    Consultants:  Nephrology  CCM-signed off on  3/5.  Procedures/Studies:  Dg Chest 2 View 04/19/2012 -->  Findings consistent with fluid overload and diffuse interstitial edema.   HD  Antibiotics:  None  Code Status: Full Family Communication:  discussed with patient  Disposition Plan:  continue management in step down unit for additional 24 hours. Home when medically stable.  HPI/Subjective:  Denies any further headache or nausea. Denies chest pain or dyspnea. Tolerated breakfast.   Objective: Filed Vitals:   04/21/12 0500 04/21/12 0508 04/21/12 0600 04/21/12 0802  BP: 203/84 203/84 175/74   Pulse: 70 73 66   Temp:    98.2 F (36.8 C)  TempSrc:    Oral  Resp: 23  22   Height:      Weight:      SpO2: 94%  94%     Intake/Output Summary (Last 24 hours) at 04/21/12 0955 Last data filed at 04/21/12 0800  Gross per 24 hour  Intake    503 ml  Output   5145 ml  Net  -4642 ml    Exam:   General:  Pt is alert, follows commands appropriately, not in acute distress  Cardiovascular: Regular rate and rhythm, S1/S2, no murmurs, no rubs, no gallops. Telemetry shows sinus rhythm in the 60s without arrhythmia alarms. 1+ pitting bilateral leg edema.   Respiratory:  occasional basal crackles but otherwise clear to auscultation. No increased work of breathing.   Abdomen: Soft, non tender, non distended, bowel sounds present, no guarding  Extremities: +1 bilateral pitting LE edema, pulses DP and PT palpable bilaterally  Neuro: Grossly nonfocal. Alert and oriented.  Data Reviewed: Basic Metabolic Panel:  Recent Labs Lab 04/19/12 1439 04/20/12 1400 04/21/12 0430  NA 139 136 140  K 4.4 4.2 3.8  CL 97 95* 97  CO2 25 27 29   GLUCOSE 97 125* 113*  BUN 54* 32* 16  CREATININE 15.99* 11.15* 7.70*  CALCIUM 8.5 9.1 9.1  PHOS  --  7.3* 5.2*   Liver Function Tests:  Recent Labs Lab 04/19/12 1439 04/20/12 1400 04/21/12 0430  AST 6  --   --   ALT 8  --   --   ALKPHOS 87  --   --   BILITOT 0.3  --   --   PROT 6.9  --    --   ALBUMIN 3.6 3.6 3.8   CBC:  Recent Labs Lab 04/19/12 1439 04/20/12 0740 04/21/12 0430  WBC 8.5 8.5 7.4  NEUTROABS 5.0  --   --   HGB 10.5* 11.0* 10.9*  HCT 31.1* 32.6* 32.7*  MCV 88.6 86.9 87.2  PLT 292 307 301   Cardiac Enzymes:  Recent Labs Lab 04/19/12 1447 04/19/12 2314 04/20/12 0740  TROPONINI <0.30 <0.30 <0.30   Scheduled Meds: Scheduled Meds: . amLODipine  10 mg Oral Daily  . ARIPiprazole  2 mg Oral QPM  . aspirin EC  162 mg Oral Daily  . calcium acetate  2,001 mg Oral TID WC  . cinacalcet  30 mg Oral Q breakfast  . cloNIDine  0.2 mg Oral Q8H  . darbepoetin (ARANESP) injection - DIALYSIS  100 mcg Intravenous Q Wed-HD  . doxercalciferol  6 mcg Intravenous Q M,W,F-HD  . escitalopram  10 mg Oral QPM  . gelatin adsorbable  1 each Topical Once  . heparin  5,000 Units Subcutaneous Q8H  . labetalol  200 mg Oral BID  . lisinopril  10 mg Oral Daily  . LORazepam  1 mg Oral Q M,W,F  . sodium chloride  3 mL Intravenous Q12H  . sodium chloride  3 mL Intravenous Q12H   Continuous Infusions:  PRN Meds:.sodium chloride, acetaminophen, acetaminophen, albuterol, calcium acetate, hydrALAZINE, hydrOXYzine, morphine injection, ondansetron (ZOFRAN) IV, ondansetron, oxyCODONE, sodium chloride    HONGALGI,ANAND, MD  [facility] Pager [phone]  If 7PM-7AM, please contact night-coverage [email or web address] Password [email or web address] 04/21/2012, 9:55 AM   LOS: 2 days

## 2012-04-21 NOTE — Progress Notes (Signed)
  Echocardiogram 2D Echocardiogram has been performed.  Gabriel Bell 04/21/2012, 10:48 AM

## 2012-04-21 NOTE — Significant Event (Signed)
Rapid Response Event Note  Called to see pt for BP 260/140 & pt with c/o dizziness & nausea  Overview: Time Called: 0141 Arrival Time: 0143 Event Type: Cardiac  Initial Focused Assessment: Pt with cont. HTN, dizziness & nausea not relieved by prn IV meds  Interventions:  Plan tx to ICU & IV antiHTN for better control  Event Summary: Name of Physician Notified: Merdis Delay, NP at 0230    at    Outcome: Transferred (Comment)     Pugh, April Hedgecock

## 2012-04-21 NOTE — Progress Notes (Signed)
S: breathing better.  Tolerated fluid removal well O:BP 175/74  Pulse 66  Temp(Src) 98.2 F (36.8 C) (Oral)  Resp 22  Ht 5\' 10"  (1.778 m)  Wt 127 kg (279 lb 15.8 oz)  BMI 40.17 kg/m2  SpO2 94%  Intake/Output Summary (Last 24 hours) at 04/21/12 1154 Last data filed at 04/21/12 1100  Gross per 24 hour  Intake    743 ml  Output   5145 ml  Net  -4402 ml   Weight change: -5 kg (-11 lb 0.4 oz) Gen: awake and alert CVS:RRR Resp:faint basilar crackles Abd:+ BS NTND Ext: no edema.  Lt AVF + bruit NEURO:CNI Ox3 no asterixis   . amLODipine  10 mg Oral Daily  . ARIPiprazole  2 mg Oral QPM  . aspirin EC  162 mg Oral Daily  . calcium acetate  2,001 mg Oral TID WC  . cinacalcet  30 mg Oral Q breakfast  . cloNIDine  0.2 mg Oral Q8H  . darbepoetin (ARANESP) injection - DIALYSIS  100 mcg Intravenous Q Wed-HD  . doxercalciferol  6 mcg Intravenous Q M,W,F-HD  . escitalopram  10 mg Oral QPM  . gelatin adsorbable  1 each Topical Once  . heparin  5,000 Units Subcutaneous Q8H  . labetalol  200 mg Oral BID  . [START ON 04/22/2012] lisinopril  20 mg Oral Daily  . LORazepam  1 mg Oral Q M,W,F  . sodium chloride  3 mL Intravenous Q12H  . sodium chloride  3 mL Intravenous Q12H   Dg Chest 2 View  04/19/2012  *RADIOLOGY REPORT*  Clinical Data: Short of breath.  Dialysis patient.  CHEST - 2 VIEW  Comparison: 01/08/2012  Findings: Cardiac enlargement with vascular congestion and interstitial edema.  No significant pleural effusion.  IMPRESSION: Findings consistent with fluid overload and diffuse interstitial edema.   Original Report Authenticated By: Janeece Riggers, M.D.    BMET    Component Value Date/Time   NA 140 04/21/2012 0430   K 3.8 04/21/2012 0430   CL 97 04/21/2012 0430   CO2 29 04/21/2012 0430   GLUCOSE 113* 04/21/2012 0430   BUN 16 04/21/2012 0430   CREATININE 7.70* 04/21/2012 0430   CALCIUM 9.1 04/21/2012 0430   CALCIUM 8.7 03/03/2009 0624   GFRNONAA 7* 04/21/2012 0430   GFRAA 8* 04/21/2012 0430    CBC    Component Value Date/Time   WBC 7.4 04/21/2012 0430   RBC 3.75* 04/21/2012 0430   HGB 10.9* 04/21/2012 0430   HCT 32.7* 04/21/2012 0430   PLT 301 04/21/2012 0430   MCV 87.2 04/21/2012 0430   MCH 29.1 04/21/2012 0430   MCHC 33.3 04/21/2012 0430   RDW 15.2 04/21/2012 0430   LYMPHSABS 2.2 04/19/2012 1439   MONOABS 0.5 04/19/2012 1439   EOSABS 0.6 04/19/2012 1439   BASOSABS 0.0 04/19/2012 1439     Assessment: 1. Volume overload and pulm edema, improved 2. HTN improving 3. Sec HPTH 4. Anemia 5 ESRD   Plan: 1. HD today to cont to decrease DW and this is his usual day 2. Increase lisinopril to 20mg    MATTINGLY,MICHAEL T

## 2012-04-22 MED ORDER — CLONIDINE HCL 0.2 MG PO TABS: 0.2000 mg | ORAL_TABLET | Freq: Three times a day (TID) | ORAL | Status: AC

## 2012-04-22 MED ORDER — CINACALCET HCL 30 MG PO TABS: 30.0000 mg | ORAL_TABLET | Freq: Every day | ORAL | Status: DC

## 2012-04-22 MED ORDER — LISINOPRIL 10 MG PO TABS: 20.0000 mg | ORAL_TABLET | Freq: Every day | ORAL | Status: AC

## 2012-04-22 MED ORDER — LABETALOL HCL 200 MG PO TABS: 200.0000 mg | ORAL_TABLET | Freq: Two times a day (BID) | ORAL | Status: DC

## 2012-04-22 NOTE — Progress Notes (Signed)
S: vomited last night and small amount this am.  Feels well now. Tolerated fluid removal well O:BP 146/73  Pulse 62  Temp(Src) 98.5 F (36.9 C) (Oral)  Resp 18  Ht 5\' 10"  (1.778 m)  Wt 123.8 kg (272 lb 14.9 oz)  BMI 39.16 kg/m2  SpO2 97%  Intake/Output Summary (Last 24 hours) at 04/22/12 4098 Last data filed at 04/21/12 2000  Gross per 24 hour  Intake    720 ml  Output   4300 ml  Net  -3580 ml   Weight change: -3.7 kg (-8 lb 2.5 oz) Gen: awake and alert CVS:RRR Resp:clear Abd:+ BS NTND Ext: no edema.  Lt AVF + bruit NEURO:CNI Ox3 no asterixis   . amLODipine  10 mg Oral Daily  . ARIPiprazole  2 mg Oral QPM  . aspirin EC  162 mg Oral Daily  . calcium acetate  2,001 mg Oral TID WC  . cinacalcet  30 mg Oral Q breakfast  . cloNIDine  0.2 mg Oral Q8H  . darbepoetin (ARANESP) injection - DIALYSIS  100 mcg Intravenous Q Wed-HD  . doxercalciferol  6 mcg Intravenous Q M,W,F-HD  . escitalopram  10 mg Oral QPM  . gelatin adsorbable  1 each Topical Once  . heparin  5,000 Units Subcutaneous Q8H  . labetalol  200 mg Oral BID  . lisinopril  20 mg Oral Daily  . LORazepam  1 mg Oral Q M,W,F  . sodium chloride  3 mL Intravenous Q12H  . sodium chloride  3 mL Intravenous Q12H   No results found. BMET    Component Value Date/Time   NA 140 04/21/2012 0430   K 3.8 04/21/2012 0430   CL 97 04/21/2012 0430   CO2 29 04/21/2012 0430   GLUCOSE 113* 04/21/2012 0430   BUN 16 04/21/2012 0430   CREATININE 7.70* 04/21/2012 0430   CALCIUM 9.1 04/21/2012 0430   CALCIUM 8.7 03/03/2009 0624   GFRNONAA 7* 04/21/2012 0430   GFRAA 8* 04/21/2012 0430   CBC    Component Value Date/Time   WBC 7.4 04/21/2012 0430   RBC 3.75* 04/21/2012 0430   HGB 10.9* 04/21/2012 0430   HCT 32.7* 04/21/2012 0430   PLT 301 04/21/2012 0430   MCV 87.2 04/21/2012 0430   MCH 29.1 04/21/2012 0430   MCHC 33.3 04/21/2012 0430   RDW 15.2 04/21/2012 0430   LYMPHSABS 2.2 04/19/2012 1439   MONOABS 0.5 04/19/2012 1439   EOSABS 0.6 04/19/2012 1439   BASOSABS 0.0  04/19/2012 1439     Assessment: 1. Volume overload and pulm edema, improved 2. HTN improving 3. Sec HPTH 4. Anemia 5 ESRD   Plan: 1. Wt down to 123kg and BP better.  He can go from my standpoint if able to keep food down.  I will call his dialysis unit with his new DW   MATTINGLY,MICHAEL T

## 2012-04-22 NOTE — Consult Note (Deleted)
Physician Discharge Summary  Gabriel Bell IHK:[id number] DOB: [birth year] DOA: 04/19/2012  PCP: Dorrene German, MD  Admit date: 04/19/2012 Discharge date: 04/22/2012  Time spent: Greater than 30 minutes  Recommendations for Outpatient Follow-up:  1. With Dr. Fleet Contras, PCP 2. Hemodialysis Center: Keep up regular dialysis appointments for Mondays, Wednesdays and Fridays  Discharge Diagnoses:  Active Problems:   ESRD (end stage renal disease) on dialysis   Bipolar 1 disorder, manic, moderate   Pulmonary edema   Fluid overload   HTN (hypertension), malignant   Hypertensive emergency   Anemia in chronic kidney disease   Discharge Condition: Improved & Stable  Diet recommendation: Renal diet  Filed Weights   04/21/12 0316 04/21/12 1400 04/21/12 1822  Weight: 127 kg (279 lb 15.8 oz) 127.8 kg (281 lb 12 oz) 123.8 kg (272 lb 14.9 oz)    History of present illness:  Pt is 52 year old male, with known history of ESRD on HD M-W-F, malignant HTN, bipolar disorder, s/p CVA, urolithiasis, possible medication noncompliance, referred to [facility], according to patient, from [facility] outpatient dialysis center at [facility]. He states that he went for his regular dialysis, BP was found to be very high, and HD was not done. On arrival in the ED, BP was 234/98, patient complained of chest tightness and anxiety. CXR showed findings consistent with fluid overload and diffuse interstitial edema. Patient claims to have had occasional cough with hemoptysis for about 3 weeks, without chest pain, fever or chills, and progressive bilateral LE edema for about 3 months.    Hospital Course:  1. Acute on chronic diastolic CHF/Pulmonary edema: secondary to volume overload in the setting of ESRD and medical noncompliance. HD per nephrology team, appreciate assistance. Has been dialyzed on 3/3-3/5. Nephrology has seen patient and cleared him for discharge. They will call his dialysis Center with new dry weight.  Echo results as below. 2. Hypertensive emergency/HTN (hypertension), malignant: secondary to volume status. Patient was transferred to ICU overnight 3/5 secondary to headache and nausea in the context of markedly elevated blood pressures. Blood pressure control has improved. CCM signed off. Continue amlodipine 10 mg daily, lisinopril 20 mg daily, labetalol 200 mg twice a day and clonidine 0.2 mg by mouth 3 times a day. HD for 3 days has also helped. Outpatient followup. 3. Anemia of chronic kidney disease: Stable 4. ESRD: Nephrology following. Patient was dialyzed 3 days in a row. They have cleared him for discharge. 5. History of bipolar disorder: Continue Abilify. 6. Nausea & Vomiting: unclear etiology. Resolved. Tolerated lunch.   Procedures:  Hemodialysis x3 (3/3-3/5)   Consultations:  Nephrology  Critical care medicine  Discharge Exam:  Complaints: Patient had an episode of nausea and vomiting last night and one episode this morning. Since then patient has tolerated diet without any further episodes. Patient eager to go home. His uncle apparently was found dead at home and the funeral is on Saturday.  Filed Vitals:   04/22/12 0700 04/22/12 0800 04/22/12 0827 04/22/12 1126  BP: 146/73 161/79    Pulse:      Temp:   97.6 F (36.4 C) 98.6 F (37 C)  TempSrc:   Oral Oral  Resp: 18 23    Height:      Weight:      SpO2:  97%      General: Pleasant and comfortable, sitting on chair.  Cardiovascular: Regular rate and rhythm, S1/S2, no murmurs, no rubs, no gallops. Telemetry shows sinus rhythm in the  60s without arrhythmia alarms. 1+ pitting bilateral leg edema- decreasing.  Respiratory: clear to auscultation. No increased work of breathing.  Abdomen: Soft, non tender, non distended, bowel sounds present, no guarding  Extremities: +1 bilateral pitting LE edema, pulses DP and PT palpable bilaterally  Neuro: Grossly nonfocal. Alert and oriented.   Discharge Instructions       Discharge Orders   Future Orders Complete By Expires     (HEART FAILURE PATIENTS) Call MD:  Anytime you have any of the following symptoms: 1) 3 pound weight gain in 24 hours or 5 pounds in 1 week 2) shortness of breath, with or without a dry hacking cough 3) swelling in the hands, feet or stomach 4) if you have to sleep on extra pillows at night in order to breathe.  As directed     Call MD for:  difficulty breathing, headache or visual disturbances  As directed     Call MD for:  extreme fatigue  As directed     Call MD for:  persistant dizziness or light-headedness  As directed     Call MD for:  persistant nausea and vomiting  As directed     Call MD for:  severe uncontrolled pain  As directed     Discharge instructions  As directed     Comments:      Diet: Renal diet.    Increase activity slowly  As directed         Medication List    STOP taking these medications       carvedilol 25 MG tablet  Commonly known as:  COREG      TAKE these medications       amLODipine 10 MG tablet  Commonly known as:  NORVASC  Take 10 mg by mouth daily.     ARIPiprazole 2 MG tablet  Commonly known as:  ABILIFY  Take 2 mg by mouth every evening.     aspirin EC 81 MG tablet  Take 162 mg by mouth daily.     calcium acetate 667 MG capsule  Commonly known as:  PHOSLO  Take 667-2,001 mg by mouth 5 (five) times daily. 3 capsules (2001 mg) with meals and 1 capsule (667 mg) with snacks     cinacalcet 30 MG tablet  Commonly known as:  SENSIPAR  Take 1 tablet (30 mg total) by mouth daily with breakfast.     cloNIDine 0.2 MG tablet  Commonly known as:  CATAPRES  Take 1 tablet (0.2 mg total) by mouth 3 (three) times daily.     escitalopram 10 MG tablet  Commonly known as:  LEXAPRO  Take 10 mg by mouth every evening.     HYDROcodone-acetaminophen 5-325 MG per tablet  Commonly known as:  NORCO/VICODIN  Take 2 tablets by mouth every 6 (six) hours as needed for pain.     hydrOXYzine 25 MG  tablet  Commonly known as:  ATARAX/VISTARIL  Take 25 mg by mouth 3 (three) times daily as needed. For itching     labetalol 200 MG tablet  Commonly known as:  NORMODYNE  Take 1 tablet (200 mg total) by mouth 2 (two) times daily.     lisinopril 10 MG tablet  Commonly known as:  PRINIVIL,ZESTRIL  Take 2 tablets (20 mg total) by mouth daily.     LORazepam 1 MG tablet  Commonly known as:  ATIVAN  Take 1 mg by mouth every Monday, Wednesday, and Friday. Monday Wednesday and Friday before hemodialysis.  Follow-up Information   Schedule an appointment as soon as possible for a visit with Dorrene German, MD.   Contact information:   [address] [place] [address] [phone]       Follow up with Hemodialysis Center. (Keep up regular hemodialysis appointments for Mondays, Wednesdays and Fridays.)        The results of significant diagnostics from this hospitalization (including imaging, microbiology, ancillary and laboratory) are listed below for reference.    Significant Diagnostic Studies: Dg Chest 2 View  04/19/2012  *RADIOLOGY REPORT*  Clinical Data: Short of breath.  Dialysis patient.  CHEST - 2 VIEW  Comparison: 01/08/2012  Findings: Cardiac enlargement with vascular congestion and interstitial edema.  No significant pleural effusion.  IMPRESSION: Findings consistent with fluid overload and diffuse interstitial edema.   Original Report Authenticated By: Janeece Riggers, M.D.     Microbiology: Recent Results (from the past 240 hour(s))  MRSA PCR SCREENING     Status: None   Collection Time    04/21/12  3:13 AM      Result Value Range Status   MRSA by PCR NEGATIVE  NEGATIVE Final   Comment:            The GeneXpert MRSA Assay (FDA     approved for NASAL specimens     only), is one component of a     comprehensive MRSA colonization     surveillance program. It is not     intended to diagnose MRSA     infection nor to guide or     monitor treatment for      MRSA infections.     Labs: Basic Metabolic Panel:  Recent Labs Lab 04/19/12 1439 04/20/12 1400 04/21/12 0430  NA 139 136 140  K 4.4 4.2 3.8  CL 97 95* 97  CO2 25 27 29   GLUCOSE 97 125* 113*  BUN 54* 32* 16  CREATININE 15.99* 11.15* 7.70*  CALCIUM 8.5 9.1 9.1  PHOS  --  7.3* 5.2*   Liver Function Tests:  Recent Labs Lab 04/19/12 1439 04/20/12 1400 04/21/12 0430  AST 6  --   --   ALT 8  --   --   ALKPHOS 87  --   --   BILITOT 0.3  --   --   PROT 6.9  --   --   ALBUMIN 3.6 3.6 3.8   No results found for this basename: LIPASE, AMYLASE,  in the last 168 hours No results found for this basename: AMMONIA,  in the last 168 hours CBC:  Recent Labs Lab 04/19/12 1439 04/20/12 0740 04/21/12 0430  WBC 8.5 8.5 7.4  NEUTROABS 5.0  --   --   HGB 10.5* 11.0* 10.9*  HCT 31.1* 32.6* 32.7*  MCV 88.6 86.9 87.2  PLT 292 307 301   Cardiac Enzymes:  Recent Labs Lab 04/19/12 1447 04/19/12 2314 04/20/12 0740  TROPONINI <0.30 <0.30 <0.30   BNP: BNP (last 3 results)  Recent Labs  04/19/12 1439  PROBNP 37827.0*   CBG: No results found for this basename: GLUCAP,  in the last 168 hours  Additional labs: 2 D Echo: Mild LVH, LEVF 55-60% & Grade 2 diastolic dysfunction. No AS    Signed:  HONGALGI,ANAND  [facility] 04/22/2012, 1:42 PM

## 2012-04-22 NOTE — Progress Notes (Signed)
Patient ID: Gabriel Bell, male   DOB: Oct 16, 1960, 52 y.o.   MRN: [id number]  [facility] PROGRESS NOTE  EDIBERTO SENS WNU:[id number] DOB: 10/15/1960 DOA: 04/19/2012 PCP: Dorrene German, MD  Brief narrative: Pt is 52 year old male, with known history of ESRD on HD M-W-F, malignant HTN, bipolar disorder, s/p CVA, urolithiasis, possible medication noncompliance, referred to [facility], according to patient, from [facility] outpatient dialysis center at [facility]. He states that he went for his regular dialysis, BP was found to be very high, and HD was not done. On arrival in the ED, BP was 234/98, patient complained of chest tightness and anxiety. CXR showed findings consistent with fluid overload and diffuse interstitial edema. Patient claims to have had occasional cough with hemoptysis for about 3 weeks, without chest pain, fever or chills, and progressive bilateral LE edema for about 3 months.   Active Problems: 1. Acute on chronic diastolic CHF/Pulmonary edema: secondary to volume overload in the setting of ESRD and medical noncompliance. HD per nephrology team, appreciate assistance. Has been dialyzed on 3/3 and 3/4. Nephrology will follow today to decide HD needs for today. Echo results as below. 2. Hypertensive emergency/HTN (hypertension), malignant: secondary to volume status. Patient was transferred to ICU overnight 3/5 secondary to headache and nausea in the context of markedly elevated blood pressures. Blood pressure control has improved. CCM signed off. Continue amlodipine 10 mg daily, lisinopril 20 mg daily, labetalol 200 mg twice a day and clonidine 0.2 mg by mouth 3 times a day and when necessary hydralazine. HD for 3 days has also helped. 3. Anemia of chronic kidney disease: Stable 4. ESRD: Nephrology following. HD per nephrology 5. History of bipolar disorder: Continue Abilify. 6. Nausea & Vomiting: unclear etiology. Continue diet as tolerated and  monitor.   Consultants:  Nephrology  CCM-signed off on 3/5.  Procedures/Studies:  Dg Chest 2 View 04/19/2012 -->  Findings consistent with fluid overload and diffuse interstitial edema.   HD  Antibiotics:  None  Code Status: Full Family Communication:  discussed with patient  Disposition Plan:  Transfer to regular bed. If tolerates diet without further nausea or vomiting, D/C later this afternoon.  HPI/Subjective: Nausea and vomiting once last night and this am. No coffee ground/blood. No abdominal pain or diarrhea.  Objective: Filed Vitals:   04/22/12 0400 04/22/12 0500 04/22/12 0600 04/22/12 0700  BP: 179/72 144/62 166/65 146/73  Pulse:      Temp:      TempSrc:      Resp: 22 22 20 18   Height:      Weight:      SpO2:        Intake/Output Summary (Last 24 hours) at 04/22/12 0809 Last data filed at 04/21/12 2000  Gross per 24 hour  Intake    720 ml  Output   4300 ml  Net  -3580 ml    Exam:   General:  Pleasant and comfortable, sitting on chair.  Cardiovascular: Regular rate and rhythm, S1/S2, no murmurs, no rubs, no gallops. Telemetry shows sinus rhythm in the 60s without arrhythmia alarms. 1+ pitting bilateral leg edema- decreasing.   Respiratory:  clear to auscultation. No increased work of breathing.   Abdomen: Soft, non tender, non distended, bowel sounds present, no guarding  Extremities: +1 bilateral pitting LE edema, pulses DP and PT palpable bilaterally  Neuro: Grossly nonfocal. Alert and oriented.  Data Reviewed: Basic Metabolic Panel:  Recent Labs Lab 04/19/12 1439 04/20/12 1400 04/21/12 0430  NA 139 136 140  K 4.4 4.2 3.8  CL 97 95* 97  CO2 25 27 29   GLUCOSE 97 125* 113*  BUN 54* 32* 16  CREATININE 15.99* 11.15* 7.70*  CALCIUM 8.5 9.1 9.1  PHOS  --  7.3* 5.2*   Liver Function Tests:  Recent Labs Lab 04/19/12 1439 04/20/12 1400 04/21/12 0430  AST 6  --   --   ALT 8  --   --   ALKPHOS 87  --   --   BILITOT 0.3  --   --    PROT 6.9  --   --   ALBUMIN 3.6 3.6 3.8   CBC:  Recent Labs Lab 04/19/12 1439 04/20/12 0740 04/21/12 0430  WBC 8.5 8.5 7.4  NEUTROABS 5.0  --   --   HGB 10.5* 11.0* 10.9*  HCT 31.1* 32.6* 32.7*  MCV 88.6 86.9 87.2  PLT 292 307 301   Cardiac Enzymes:  Recent Labs Lab 04/19/12 1447 04/19/12 2314 04/20/12 0740  TROPONINI <0.30 <0.30 <0.30   Scheduled Meds: Scheduled Meds: . amLODipine  10 mg Oral Daily  . ARIPiprazole  2 mg Oral QPM  . aspirin EC  162 mg Oral Daily  . calcium acetate  2,001 mg Oral TID WC  . cinacalcet  30 mg Oral Q breakfast  . cloNIDine  0.2 mg Oral Q8H  . darbepoetin (ARANESP) injection - DIALYSIS  100 mcg Intravenous Q Wed-HD  . doxercalciferol  6 mcg Intravenous Q M,W,F-HD  . escitalopram  10 mg Oral QPM  . gelatin adsorbable  1 each Topical Once  . heparin  5,000 Units Subcutaneous Q8H  . labetalol  200 mg Oral BID  . lisinopril  20 mg Oral Daily  . LORazepam  1 mg Oral Q M,W,F  . sodium chloride  3 mL Intravenous Q12H  . sodium chloride  3 mL Intravenous Q12H   Continuous Infusions:  PRN Meds:.sodium chloride, acetaminophen, acetaminophen, albuterol, calcium acetate, hydrALAZINE, hydrOXYzine, morphine injection, ondansetron (ZOFRAN) IV, ondansetron, oxyCODONE, sodium chloride  Additional Labs  2 D Echo: Mild LVH, LEVF 55-60% & Grade 2 diastolic dysfunction. No AS  HONGALGI,ANAND, MD  [facility] Pager [phone]  If 7PM-7AM, please contact night-coverage [email or web address] Password [email or web address] 04/22/2012, 8:09 AM   LOS: 3 days

## 2012-04-23 NOTE — Discharge Summary (Signed)
Physician Discharge Summary  Gabriel Bell WUJ:[id number] DOB: [birth year] DOA: 04/19/2012  PCP: Dorrene German, MD  Admit date: 04/19/2012 Discharge date: 04/22/2012  Time spent: Greater than 30 minutes  Recommendations for Outpatient Follow-up:  1. With Dr. Fleet Contras, PCP 2. Hemodialysis Center: Keep up regular dialysis appointments for Mondays, Wednesdays and Fridays  Discharge Diagnoses:  Active Problems:   ESRD (end stage renal disease) on dialysis   Bipolar 1 disorder, manic, moderate   Pulmonary edema   Fluid overload   HTN (hypertension), malignant   Hypertensive emergency   Anemia in chronic kidney disease   Discharge Condition: Improved & Stable  Diet recommendation: Renal diet  Filed Weights   04/21/12 0316 04/21/12 1400 04/21/12 1822  Weight: 127 kg (279 lb 15.8 oz) 127.8 kg (281 lb 12 oz) 123.8 kg (272 lb 14.9 oz)    History of present illness:  Pt is 52 year old male, with known history of ESRD on HD M-W-F, malignant HTN, bipolar disorder, s/p CVA, urolithiasis, possible medication noncompliance, referred to [facility], according to patient, from [facility] outpatient dialysis center at [facility]. He states that he went for his regular dialysis, BP was found to be very high, and HD was not done. On arrival in the ED, BP was 234/98, patient complained of chest tightness and anxiety. CXR showed findings consistent with fluid overload and diffuse interstitial edema. Patient claims to have had occasional cough with hemoptysis for about 3 weeks, without chest pain, fever or chills, and progressive bilateral LE edema for about 3 months.    Hospital Course:  1. Acute on chronic diastolic CHF/Pulmonary edema: secondary to volume overload in the setting of ESRD and medical noncompliance. HD per nephrology team, appreciate assistance. Has been dialyzed on 3/3-3/5. Nephrology has seen patient and cleared him for discharge. They will call his dialysis Center with new dry weight.  Echo results as below. 2. Hypertensive emergency/HTN (hypertension), malignant: secondary to volume status. Patient was transferred to ICU overnight 3/5 secondary to headache and nausea in the context of markedly elevated blood pressures. Blood pressure control has improved. CCM signed off. Continue amlodipine 10 mg daily, lisinopril 20 mg daily, labetalol 200 mg twice a day and clonidine 0.2 mg by mouth 3 times a day. HD for 3 days has also helped. Outpatient followup. 3. Anemia of chronic kidney disease: Stable 4. ESRD: Nephrology following. Patient was dialyzed 3 days in a row. They have cleared him for discharge. 5. History of bipolar disorder: Continue Abilify. 6. Nausea & Vomiting: unclear etiology. Resolved. Tolerated lunch.   Procedures:  Hemodialysis x3 (3/3-3/5)   Consultations:  Nephrology  Critical care medicine  Discharge Exam:  Complaints: Patient had an episode of nausea and vomiting last night and one episode this morning. Since then patient has tolerated diet without any further episodes. Patient eager to go home. His uncle apparently was found dead at home and the funeral is on Saturday.  Filed Vitals:   04/22/12 0700 04/22/12 0800 04/22/12 0827 04/22/12 1126  BP: 146/73 161/79    Pulse:      Temp:   97.6 F (36.4 C) 98.6 F (37 C)  TempSrc:   Oral Oral  Resp: 18 23    Height:      Weight:      SpO2:  97%      General: Pleasant and comfortable, sitting on chair.  Cardiovascular: Regular rate and rhythm, S1/S2, no murmurs, no rubs, no gallops. Telemetry shows sinus rhythm in the  60s without arrhythmia alarms. 1+ pitting bilateral leg edema- decreasing.  Respiratory: clear to auscultation. No increased work of breathing.  Abdomen: Soft, non tender, non distended, bowel sounds present, no guarding  Extremities: +1 bilateral pitting LE edema, pulses DP and PT palpable bilaterally  Neuro: Grossly nonfocal. Alert and oriented.   Discharge Instructions       Discharge Orders   Future Orders Complete By Expires     (HEART FAILURE PATIENTS) Call MD:  Anytime you have any of the following symptoms: 1) 3 pound weight gain in 24 hours or 5 pounds in 1 week 2) shortness of breath, with or without a dry hacking cough 3) swelling in the hands, feet or stomach 4) if you have to sleep on extra pillows at night in order to breathe.  As directed     Call MD for:  difficulty breathing, headache or visual disturbances  As directed     Call MD for:  extreme fatigue  As directed     Call MD for:  persistant dizziness or light-headedness  As directed     Call MD for:  persistant nausea and vomiting  As directed     Call MD for:  severe uncontrolled pain  As directed     Discharge instructions  As directed     Comments:      Diet: Renal diet.    Increase activity slowly  As directed         Medication List    STOP taking these medications       carvedilol 25 MG tablet  Commonly known as:  COREG      TAKE these medications       amLODipine 10 MG tablet  Commonly known as:  NORVASC  Take 10 mg by mouth daily.     ARIPiprazole 2 MG tablet  Commonly known as:  ABILIFY  Take 2 mg by mouth every evening.     aspirin EC 81 MG tablet  Take 162 mg by mouth daily.     calcium acetate 667 MG capsule  Commonly known as:  PHOSLO  Take 667-2,001 mg by mouth 5 (five) times daily. 3 capsules (2001 mg) with meals and 1 capsule (667 mg) with snacks     cinacalcet 30 MG tablet  Commonly known as:  SENSIPAR  Take 1 tablet (30 mg total) by mouth daily with breakfast.     cloNIDine 0.2 MG tablet  Commonly known as:  CATAPRES  Take 1 tablet (0.2 mg total) by mouth 3 (three) times daily.     escitalopram 10 MG tablet  Commonly known as:  LEXAPRO  Take 10 mg by mouth every evening.     HYDROcodone-acetaminophen 5-325 MG per tablet  Commonly known as:  NORCO/VICODIN  Take 2 tablets by mouth every 6 (six) hours as needed for pain.     hydrOXYzine 25 MG  tablet  Commonly known as:  ATARAX/VISTARIL  Take 25 mg by mouth 3 (three) times daily as needed. For itching     labetalol 200 MG tablet  Commonly known as:  NORMODYNE  Take 1 tablet (200 mg total) by mouth 2 (two) times daily.     lisinopril 10 MG tablet  Commonly known as:  PRINIVIL,ZESTRIL  Take 2 tablets (20 mg total) by mouth daily.     LORazepam 1 MG tablet  Commonly known as:  ATIVAN  Take 1 mg by mouth every Monday, Wednesday, and Friday. Monday Wednesday and Friday before hemodialysis.  Follow-up Information   Schedule an appointment as soon as possible for a visit with Dorrene German, MD.   Contact information:   [address] [place] [address] [phone]       Follow up with Hemodialysis Center. (Keep up regular hemodialysis appointments for Mondays, Wednesdays and Fridays.)        The results of significant diagnostics from this hospitalization (including imaging, microbiology, ancillary and laboratory) are listed below for reference.    Significant Diagnostic Studies: Dg Chest 2 View  04/19/2012  *RADIOLOGY REPORT*  Clinical Data: Short of breath.  Dialysis patient.  CHEST - 2 VIEW  Comparison: 01/08/2012  Findings: Cardiac enlargement with vascular congestion and interstitial edema.  No significant pleural effusion.  IMPRESSION: Findings consistent with fluid overload and diffuse interstitial edema.   Original Report Authenticated By: Janeece Riggers, M.D.     Microbiology: Recent Results (from the past 240 hour(s))  MRSA PCR SCREENING     Status: None   Collection Time    04/21/12  3:13 AM      Result Value Range Status   MRSA by PCR NEGATIVE  NEGATIVE Final   Comment:            The GeneXpert MRSA Assay (FDA     approved for NASAL specimens     only), is one component of a     comprehensive MRSA colonization     surveillance program. It is not     intended to diagnose MRSA     infection nor to guide or     monitor treatment for      MRSA infections.     Labs: Basic Metabolic Panel:  Recent Labs Lab 04/19/12 1439 04/20/12 1400 04/21/12 0430  NA 139 136 140  K 4.4 4.2 3.8  CL 97 95* 97  CO2 25 27 29   GLUCOSE 97 125* 113*  BUN 54* 32* 16  CREATININE 15.99* 11.15* 7.70*  CALCIUM 8.5 9.1 9.1  PHOS  --  7.3* 5.2*   Liver Function Tests:  Recent Labs Lab 04/19/12 1439 04/20/12 1400 04/21/12 0430  AST 6  --   --   ALT 8  --   --   ALKPHOS 87  --   --   BILITOT 0.3  --   --   PROT 6.9  --   --   ALBUMIN 3.6 3.6 3.8   No results found for this basename: LIPASE, AMYLASE,  in the last 168 hours No results found for this basename: AMMONIA,  in the last 168 hours CBC:  Recent Labs Lab 04/19/12 1439 04/20/12 0740 04/21/12 0430  WBC 8.5 8.5 7.4  NEUTROABS 5.0  --   --   HGB 10.5* 11.0* 10.9*  HCT 31.1* 32.6* 32.7*  MCV 88.6 86.9 87.2  PLT 292 307 301   Cardiac Enzymes:  Recent Labs Lab 04/19/12 1447 04/19/12 2314 04/20/12 0740  TROPONINI <0.30 <0.30 <0.30   BNP: BNP (last 3 results)  Recent Labs  04/19/12 1439  PROBNP 37827.0*   CBG: No results found for this basename: GLUCAP,  in the last 168 hours  Additional labs: 2 D Echo: Mild LVH, LEVF 55-60% & Grade 2 diastolic dysfunction. No AS    Signed:  HONGALGI,ANAND  [facility] 04/23/2012, 11:46 PM

## 2012-05-26 ENCOUNTER — Emergency Department (HOSPITAL_COMMUNITY): Payer: 59

## 2012-05-26 ENCOUNTER — Emergency Department (HOSPITAL_COMMUNITY)
Admission: EM | Admit: 2012-05-26 | Discharge: 2012-05-26 | Disposition: A | Discharge status: Home / Self Care | Payer: 59 | Attending: Emergency Medicine | Admitting: Emergency Medicine

## 2012-05-26 ENCOUNTER — Encounter (HOSPITAL_COMMUNITY): Payer: Self-pay | Admitting: Family Medicine

## 2012-05-26 DIAGNOSIS — Z862 Personal history of diseases of the blood and blood-forming organs and certain disorders involving the immune mechanism: Secondary | ICD-10-CM | POA: Insufficient documentation

## 2012-05-26 DIAGNOSIS — I10 Essential (primary) hypertension: Secondary | ICD-10-CM

## 2012-05-26 DIAGNOSIS — Z79899 Other long term (current) drug therapy: Secondary | ICD-10-CM | POA: Insufficient documentation

## 2012-05-26 DIAGNOSIS — R0602 Shortness of breath: Secondary | ICD-10-CM | POA: Insufficient documentation

## 2012-05-26 DIAGNOSIS — I120 Hypertensive chronic kidney disease with stage 5 chronic kidney disease or end stage renal disease: Secondary | ICD-10-CM | POA: Insufficient documentation

## 2012-05-26 DIAGNOSIS — R252 Cramp and spasm: Secondary | ICD-10-CM | POA: Insufficient documentation

## 2012-05-26 DIAGNOSIS — N186 End stage renal disease: Secondary | ICD-10-CM | POA: Insufficient documentation

## 2012-05-26 DIAGNOSIS — Z7982 Long term (current) use of aspirin: Secondary | ICD-10-CM | POA: Insufficient documentation

## 2012-05-26 DIAGNOSIS — Z8673 Personal history of transient ischemic attack (TIA), and cerebral infarction without residual deficits: Secondary | ICD-10-CM | POA: Insufficient documentation

## 2012-05-26 DIAGNOSIS — N189 Chronic kidney disease, unspecified: Secondary | ICD-10-CM

## 2012-05-26 LAB — CBC WITH DIFFERENTIAL/PLATELET
Basophils Absolute: 0 10*3/uL (ref 0.0–0.1)
Basophils Relative: 1 % (ref 0–1)
Eosinophils Absolute: 0.6 10*3/uL (ref 0.0–0.7)
MCH: 28.5 pg (ref 26.0–34.0)
MCHC: 33.9 g/dL (ref 30.0–36.0)
Monocytes Relative: 5 % (ref 3–12)
Neutrophils Relative %: 60 % (ref 43–77)
Platelets: 257 10*3/uL (ref 150–400)
RDW: 14.3 % (ref 11.5–15.5)

## 2012-05-26 MED ORDER — LABETALOL HCL 200 MG PO TABS
200.0000 mg | ORAL_TABLET | Freq: Two times a day (BID) | ORAL | Status: DC
  Administered 2012-05-26: 200 mg via ORAL
  Filled 2012-05-26: qty 1

## 2012-05-26 MED ORDER — LISINOPRIL 20 MG PO TABS
20.0000 mg | ORAL_TABLET | Freq: Every day | ORAL | Status: DC
  Administered 2012-05-26: 20 mg via ORAL
  Filled 2012-05-26: qty 1

## 2012-05-26 MED ORDER — CLONIDINE HCL 0.2 MG PO TABS
0.2000 mg | ORAL_TABLET | Freq: Once | ORAL | Status: AC
  Administered 2012-05-26: 0.2 mg via ORAL
  Filled 2012-05-26: qty 1

## 2012-05-26 MED ORDER — CLONIDINE HCL 0.2 MG PO TABS
0.2000 mg | ORAL_TABLET | Freq: Three times a day (TID) | ORAL | Status: DC
  Administered 2012-05-26 (×2): 0.2 mg via ORAL
  Filled 2012-05-26 (×2): qty 1

## 2012-05-26 MED ORDER — SODIUM POLYSTYRENE SULFONATE 15 GM/60ML PO SUSP
50.0000 g | Freq: Once | ORAL | Status: AC
  Administered 2012-05-26: 45 g via ORAL
  Filled 2012-05-26: qty 240

## 2012-05-26 MED ORDER — ONDANSETRON 4 MG PO TBDP
4.0000 mg | ORAL_TABLET | Freq: Once | ORAL | Status: AC
  Administered 2012-05-26: 4 mg via ORAL
  Filled 2012-05-26: qty 1

## 2012-05-26 MED ORDER — LABETALOL HCL 200 MG PO TABS
200.0000 mg | ORAL_TABLET | Freq: Two times a day (BID) | ORAL | Status: DC
  Filled 2012-05-26: qty 1

## 2012-05-26 NOTE — ED Notes (Signed)
Pt has not had dialysis since last Friday 4/4 Dialysis days M, W.

## 2012-05-26 NOTE — ED Notes (Signed)
Pt sitting up in chair call light within reach

## 2012-05-26 NOTE — ED Notes (Signed)
Per pt hasn't had dialysis in almost a week due to transportation issues and was told to come here.
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
## 2012-05-26 NOTE — ED Notes (Signed)
Pt undressed, in gown, on monitor, continuous pulse oximetry and blood pressure cuff 

## 2012-05-26 NOTE — ED Notes (Signed)
Pt states understanding of discharge instructions 

## 2012-05-27 ENCOUNTER — Encounter (HOSPITAL_COMMUNITY): Payer: Self-pay | Admitting: Emergency Medicine

## 2012-05-27 DIAGNOSIS — I10 Essential (primary) hypertension: Secondary | ICD-10-CM | POA: Insufficient documentation

## 2012-05-27 DIAGNOSIS — Z79899 Other long term (current) drug therapy: Secondary | ICD-10-CM | POA: Insufficient documentation

## 2012-05-27 DIAGNOSIS — D571 Sickle-cell disease without crisis: Secondary | ICD-10-CM | POA: Insufficient documentation

## 2012-05-27 DIAGNOSIS — N186 End stage renal disease: Secondary | ICD-10-CM | POA: Insufficient documentation

## 2012-05-27 DIAGNOSIS — Z992 Dependence on renal dialysis: Secondary | ICD-10-CM | POA: Insufficient documentation

## 2012-05-27 DIAGNOSIS — Z8673 Personal history of transient ischemic attack (TIA), and cerebral infarction without residual deficits: Secondary | ICD-10-CM | POA: Insufficient documentation

## 2012-05-27 NOTE — ED Notes (Signed)
PT. REPORTS BILATERAL LEG PAIN / CRAMPING ONSET TODAY , PT, STATES HE MISSED HIS HEMODIALYSIS TODAY LAST DIALYSIS LAST Friday , STATES" I FEEL BAD " . RESPIRATIONS UNLABORED . HYPERTENSIVE AT TRIAGE .

## 2012-05-28 ENCOUNTER — Emergency Department (HOSPITAL_COMMUNITY)
Admission: EM | Admit: 2012-05-28 | Discharge: 2012-05-28 | Payer: Medicare Other | Attending: Emergency Medicine | Admitting: Emergency Medicine

## 2012-05-28 ENCOUNTER — Encounter (HOSPITAL_COMMUNITY): Payer: Self-pay | Admitting: Emergency Medicine

## 2012-05-28 ENCOUNTER — Emergency Department (HOSPITAL_COMMUNITY): Payer: Medicare Other

## 2012-05-28 DIAGNOSIS — N19 Unspecified kidney failure: Secondary | ICD-10-CM

## 2012-05-28 MED ORDER — HYDROCODONE-ACETAMINOPHEN 5-325 MG PO TABS
1.0000 | ORAL_TABLET | Freq: Once | ORAL | Status: AC
  Administered 2012-05-28: 1 via ORAL
  Filled 2012-05-28: qty 1

## 2012-05-28 NOTE — ED Notes (Signed)
[facility]AR here for pt. Pt being transported to diaylsis center

## 2012-05-28 NOTE — ED Notes (Signed)
Pt remains in br.

## 2012-05-28 NOTE — ED Notes (Signed)
Spoke to [facility]. Pt has an appointment for dialysis at 06:00. Was told that pt could wait in lobby until 06:00 then receive dialysis.
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
## 2012-05-28 NOTE — ED Notes (Signed)
Assisted pt to bathroom in lobby.

## 2013-10-20 IMAGING — CR DG CHEST 1V PORT
1 series · 1 of 1 positions shown · non-contrast
Comparison: 04/09/2011 chest radiograph and 03/27/2011 chest CT.

CLINICAL DATA: Chest pain.  Hypertension, diabetes, end-stage renal
disease and coronary artery disease.

PORTABLE CHEST - 1 VIEW

[AP]
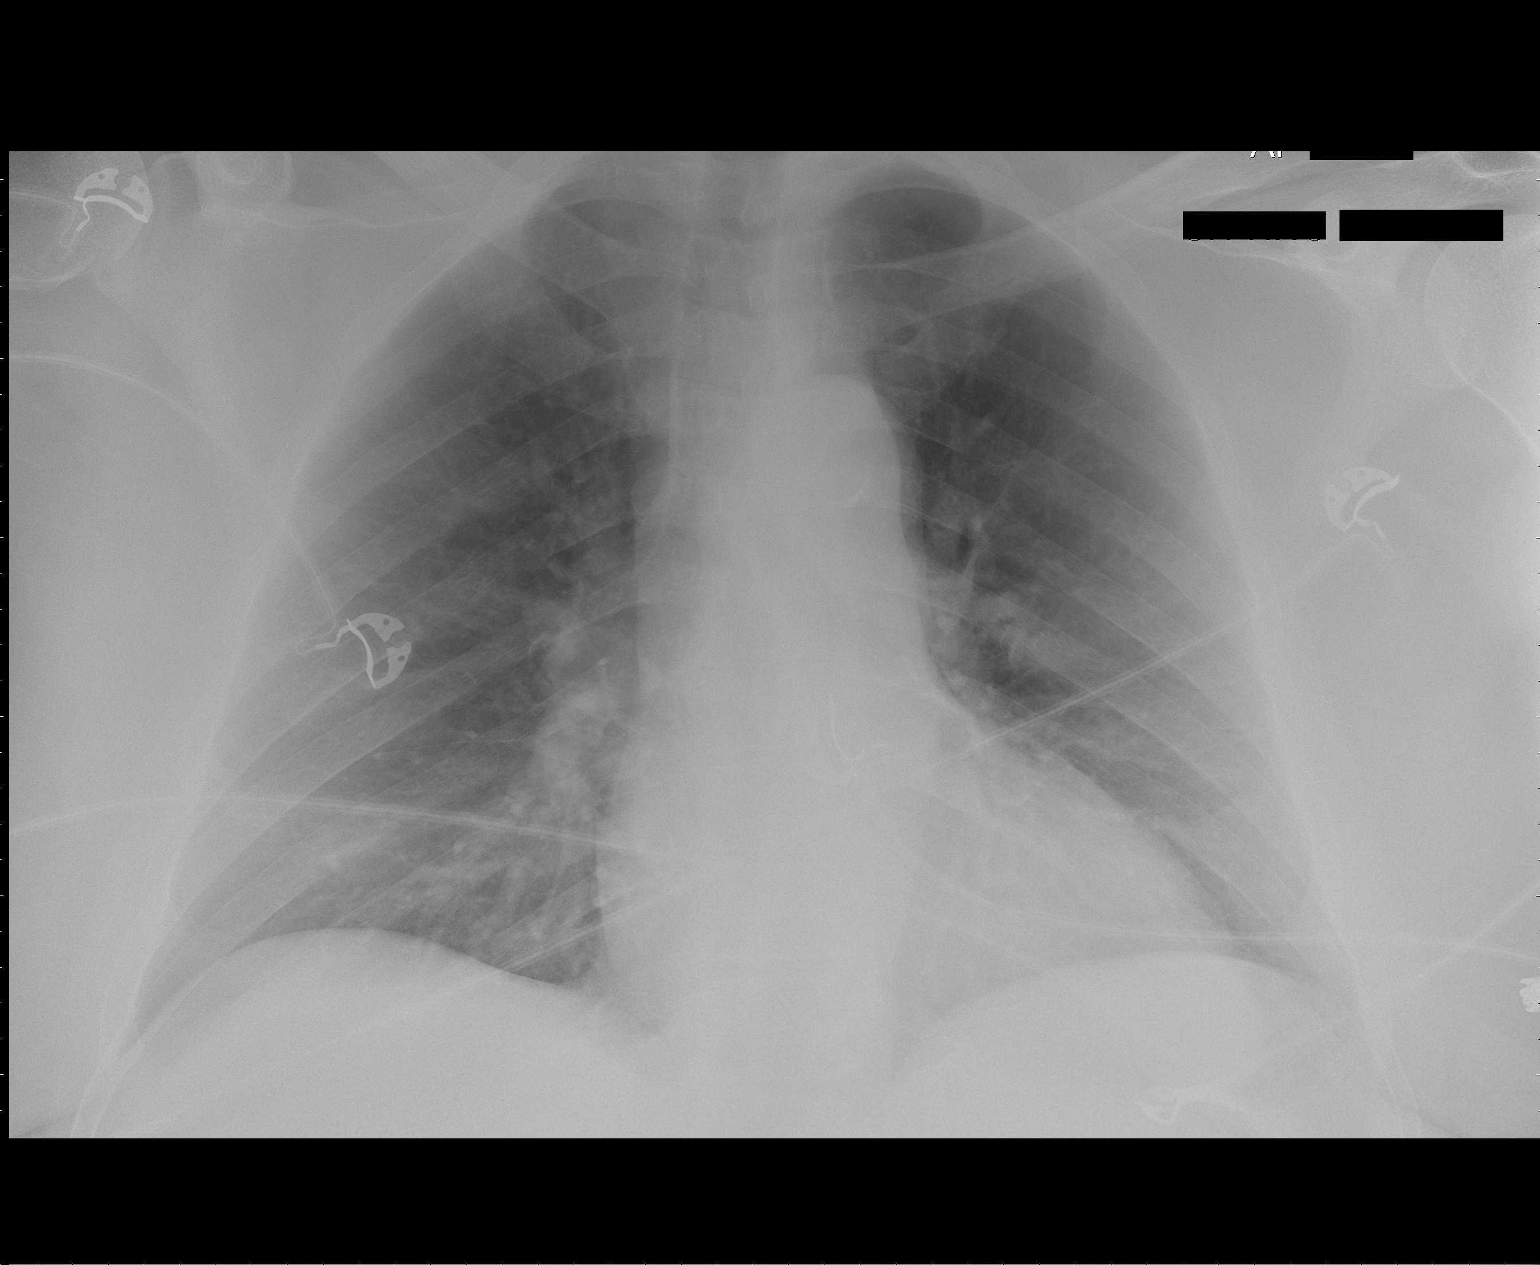

[1 of 1 positions shown; findings below may reference images not displayed]

FINDINGS: Stable cardiomegaly.  Stable mediastinal and hilar
contours.  Atherosclerotic calcification of the thoracic aortic
arch.  Normal pulmonary vascularity.  Lung volumes are slightly
low.  The lungs are clear.  No acute or suspicious bony
abnormality.
IMPRESSION: Cardiomegaly.  No acute findings.
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
## 2013-11-18 IMAGING — CR DG CHEST 1V
2 series · 2 of 2 positions shown · non-contrast
Comparison: 06/17/2011

CLINICAL DATA: Sore throat and dizziness

CHEST - 1 VIEW

[x chest ap (1 of 2)]
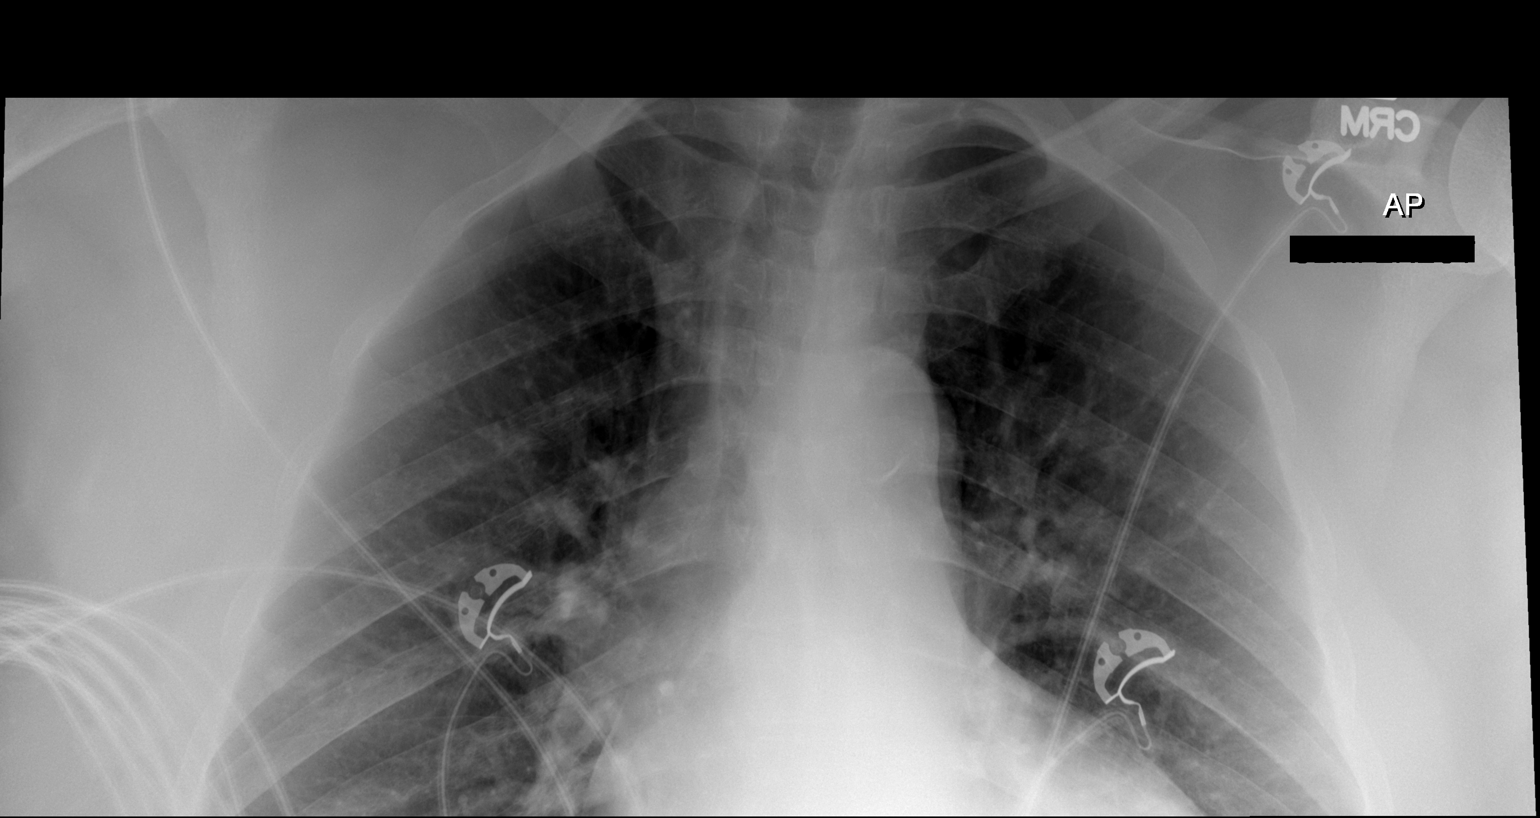

[x chest ap (2 of 2)]
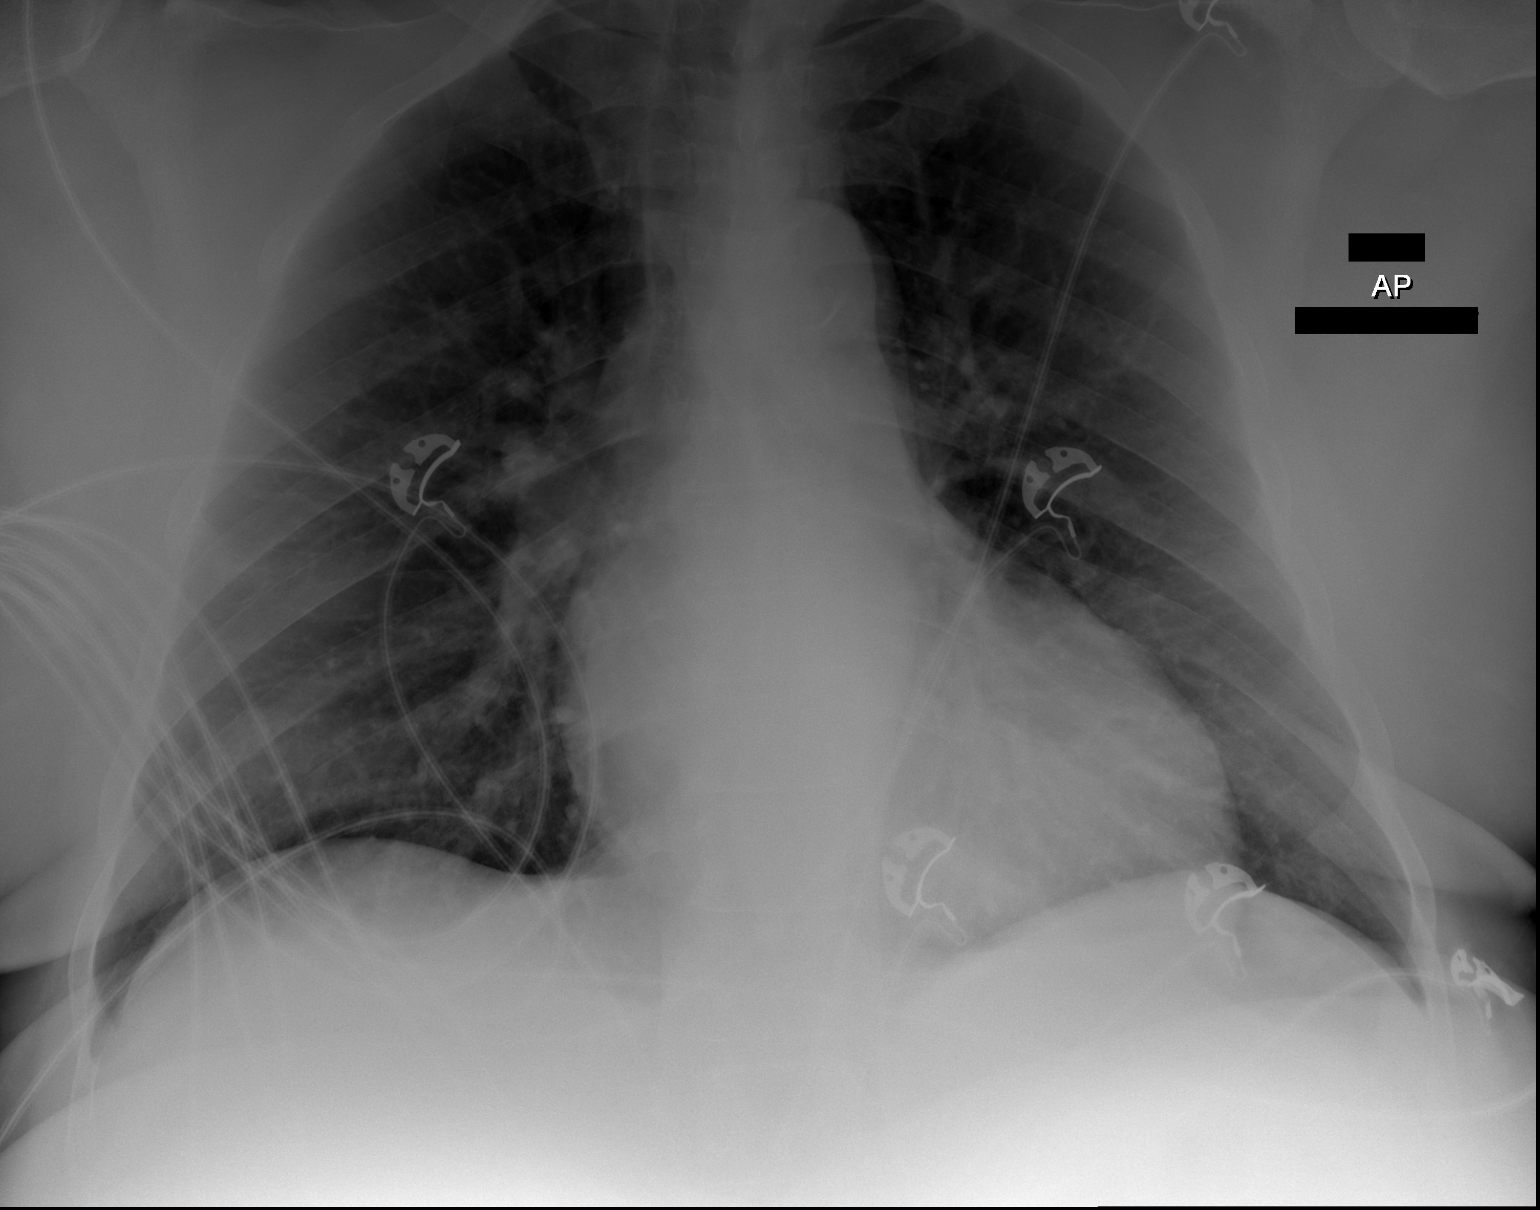

[2 of 2 positions shown; findings below may reference images not displayed]

FINDINGS: 4436 hours. The cardiopericardial silhouette is enlarged.
There is pulmonary vascular congestion without overt pulmonary
edema.No focal airspace consolidation. Telemetry leads overlie the
chest.
IMPRESSION: Cardiomegaly with vascular congestion.  Stable exam.

## 2016-11-21 ENCOUNTER — Encounter (HOSPITAL_COMMUNITY): Payer: Self-pay | Admitting: *Deleted

## 2016-11-21 ENCOUNTER — Emergency Department (HOSPITAL_COMMUNITY)
Admission: EM | Admit: 2016-11-21 | Discharge: 2016-11-21 | Disposition: A | Discharge status: Home / Self Care | Payer: Medicare Other | Attending: Emergency Medicine | Admitting: Emergency Medicine

## 2016-11-21 DIAGNOSIS — Z8673 Personal history of transient ischemic attack (TIA), and cerebral infarction without residual deficits: Secondary | ICD-10-CM | POA: Diagnosis not present

## 2016-11-21 DIAGNOSIS — F319 Bipolar disorder, unspecified: Secondary | ICD-10-CM | POA: Insufficient documentation

## 2016-11-21 DIAGNOSIS — Z79899 Other long term (current) drug therapy: Secondary | ICD-10-CM | POA: Diagnosis not present

## 2016-11-21 DIAGNOSIS — Z992 Dependence on renal dialysis: Secondary | ICD-10-CM | POA: Insufficient documentation

## 2016-11-21 DIAGNOSIS — N186 End stage renal disease: Secondary | ICD-10-CM

## 2016-11-21 DIAGNOSIS — I120 Hypertensive chronic kidney disease with stage 5 chronic kidney disease or end stage renal disease: Secondary | ICD-10-CM | POA: Insufficient documentation

## 2016-11-21 DIAGNOSIS — Z7982 Long term (current) use of aspirin: Secondary | ICD-10-CM | POA: Diagnosis not present

## 2016-11-21 LAB — CBC WITH DIFFERENTIAL/PLATELET
Basophils Absolute: 0 10*3/uL (ref 0.0–0.1)
Basophils Relative: 0 %
Eosinophils Absolute: 1.3 10*3/uL — ABNORMAL HIGH (ref 0.0–0.7)
Eosinophils Relative: 13 %
HCT: 26.4 % — ABNORMAL LOW (ref 39.0–52.0)
Hemoglobin: 8.6 g/dL — ABNORMAL LOW (ref 13.0–17.0)
LYMPHS ABS: 2.1 10*3/uL (ref 0.7–4.0)
Lymphocytes Relative: 22 %
MCH: 29.6 pg (ref 26.0–34.0)
MCHC: 32.6 g/dL (ref 30.0–36.0)
MCV: 90.7 fL (ref 78.0–100.0)
MONOS PCT: 7 %
Monocytes Absolute: 0.7 10*3/uL (ref 0.1–1.0)
NEUTROS ABS: 5.6 10*3/uL (ref 1.7–7.7)
Neutrophils Relative %: 58 %
PLATELETS: 217 10*3/uL (ref 150–400)
RBC: 2.91 MIL/uL — AB (ref 4.22–5.81)
RDW: 14.4 % (ref 11.5–15.5)
WBC: 9.7 10*3/uL (ref 4.0–10.5)

## 2016-11-21 LAB — COMPREHENSIVE METABOLIC PANEL
ALK PHOS: 681 U/L — AB (ref 38–126)
ALT: 18 U/L (ref 17–63)
AST: 17 U/L (ref 15–41)
Albumin: 3.9 g/dL (ref 3.5–5.0)
Anion gap: 18 — ABNORMAL HIGH (ref 5–15)
BILIRUBIN TOTAL: 0.5 mg/dL (ref 0.3–1.2)
BUN: 55 mg/dL — ABNORMAL HIGH (ref 6–20)
CALCIUM: 8.5 mg/dL — AB (ref 8.9–10.3)
CHLORIDE: 95 mmol/L — AB (ref 101–111)
CO2: 21 mmol/L — ABNORMAL LOW (ref 22–32)
CREATININE: 11.18 mg/dL — AB (ref 0.61–1.24)
GFR calc non Af Amer: 4 mL/min — ABNORMAL LOW (ref >60)
GFR, EST AFRICAN AMERICAN: 5 mL/min — AB (ref >60)
Glucose, Bld: 167 mg/dL — ABNORMAL HIGH (ref 65–99)
Potassium: 3.8 mmol/L (ref 3.5–5.1)
Sodium: 134 mmol/L — ABNORMAL LOW (ref 135–145)
Total Protein: 7 g/dL (ref 6.5–8.1)

## 2016-11-21 NOTE — ED Notes (Signed)
Dr. Patria Mane ( EDP ) at bedside evaluating pt.

## 2016-11-21 NOTE — Discharge Instructions (Signed)
Please return to [facility] to have your dialysis  Return to the emergency department for any new or worsening symptoms

## 2016-11-21 NOTE — ED Triage Notes (Signed)
Patient presents stating he is here for dialysis.  Last dialysis was Tuesday.  Patient not SOB

## 2016-11-21 NOTE — ED Provider Notes (Signed)
[facility]-EMERGENCY DEPT Provider Note   [name]: [id number] Arrival date & time: 11/21/16  0246     History   Chief Complaint Chief Complaint  Patient presents with  . Vascular Access Problem    HPI Gabriel Bell is a 56 y.o. male.  HPI Patient is a 56 year old male who resides in [place] who presents to emergency department requesting hemodialysis.  He has no chest pain or shortness of breath.  He states he feels like he needs dialysis.  His last dialysis was 2 and half days ago.  He is visiting [place] and will plan to return to [facility] on Monday.  He states he is here in [place] for the weekend to celebrate his birthday which is today. No fevers or chills.    Past Medical History:  Diagnosis Date  . ESRD on dialysis (HCC)   . Hypertension   . Renal failure   . Sickle cell anemia (HCC)   . Stroke [facility])    States he had 3 strokes at once    Patient Active Problem List   Diagnosis Date Noted  . Hypertensive emergency 04/21/2012  . Anemia in chronic kidney disease 04/21/2012  . Pulmonary edema 04/19/2012  . Fluid overload 04/19/2012  . HTN (hypertension), malignant 04/19/2012  . Bipolar 1 disorder, manic, moderate (HCC) 06/19/2011    Class: Acute  . Abdominal pain 06/17/2011  . ESRD (end stage renal disease) on dialysis (HCC) 06/17/2011  . Chest pain 04/10/2011  . ESRD (end stage renal disease) (HCC) 04/10/2011  . Hypertension 04/10/2011  . Pulmonary nodule 03/25/2011    Past Surgical History:  Procedure Laterality Date  . KIDNEY STONE SURGERY         Home Medications    Prior to Admission medications   Medication Sig Start Date End Date Taking? Authorizing Provider  amLODipine (NORVASC) 10 MG tablet Take 10 mg by mouth daily.    [provider]  ARIPiprazole (ABILIFY) 2 MG tablet Take 2 mg by mouth every evening.    [provider]  aspirin EC 81 MG tablet Take 162 mg by mouth daily.    [provider]  calcium acetate (PHOSLO) 667 MG capsule Take 667-2,001 mg by mouth 5 (five) times daily. 3 capsules (2001 mg) with meals and 1 capsule (667 mg) with snacks    [provider]  cinacalcet (SENSIPAR) 30 MG tablet Take 30 mg by mouth daily with breakfast. 04/22/12   Hongalgi, Maximino Greenland, MD  cloNIDine (CATAPRES) 0.2 MG tablet Take 1 tablet (0.2 mg total) by mouth 3 (three) times daily. 04/22/12   Hongalgi, Maximino Greenland, MD  escitalopram (LEXAPRO) 10 MG tablet Take 10 mg by mouth every evening.    [provider]  HYDROcodone-acetaminophen (NORCO/VICODIN) 5-325 MG per tablet Take 2 tablets by mouth every 6 (six) hours as needed for pain. 01/08/12   Gerhard Munch, MD  lisinopril (PRINIVIL,ZESTRIL) 10 MG tablet Take 2 tablets (20 mg total) by mouth daily. 04/22/12   Hongalgi, Maximino Greenland, MD  LORazepam (ATIVAN) 1 MG tablet Take 1 mg by mouth every Monday, Wednesday, and Friday. Monday Wednesday and Friday before hemodialysis.    [provider]    Family History Family History  Problem Relation Age of Onset  . Heart disease Mother   . Heart disease Father     Social History Social History  Substance Use Topics  . Smoking status: Never Smoker  . Smokeless tobacco: Never Used  . Alcohol  use No     Allergies   Patient has no known allergies.   Review of Systems Review of Systems  All other systems reviewed and are negative.    Physical Exam Updated Vital Signs BP 125/65 (BP Location: Right Arm)   Pulse (!) 59   Temp 97.8 F (36.6 C) (Oral)   Resp 16   Ht  (1.778 m)   Wt 117.2 kg (258 lb 4.8 oz)   SpO2 98%   BMI 37.06 kg/m   Physical Exam  Constitutional: He is oriented to person, place, and time. He appears well-developed and well-nourished.  HENT:  Head: Normocephalic.  Eyes: EOM are normal.  Neck: Normal range of motion.  Cardiovascular: Normal rate.   Pulmonary/Chest: Effort normal and breath sounds normal. No respiratory distress.    Abdominal: He exhibits no distension.  Musculoskeletal: Normal range of motion.  Neurological: He is alert and oriented to person, place, and time.  Psychiatric: He has a normal mood and affect.  Nursing note and vitals reviewed.    ED Treatments / Results  Labs (all labs ordered are listed, but only abnormal results are displayed) Labs Reviewed  CBC WITH DIFFERENTIAL/PLATELET - Abnormal; Notable for the following:       Result Value   RBC 2.91 (*)    Hemoglobin 8.6 (*)    HCT 26.4 (*)    Eosinophils Absolute 1.3 (*)    All other components within normal limits  COMPREHENSIVE METABOLIC PANEL - Abnormal; Notable for the following:    Sodium 134 (*)    Chloride 95 (*)    CO2 21 (*)    Glucose, Bld 167 (*)    BUN 55 (*)    Creatinine, Ser 11.18 (*)    Calcium 8.5 (*)    Alkaline Phosphatase 681 (*)    GFR calc non Af Amer 4 (*)    GFR calc Af Amer 5 (*)    Anion gap 18 (*)    All other components within normal limits    EKG  EKG Interpretation None       Radiology No results found.  Procedures Procedures (including critical care time)  Medications Ordered in ED Medications - No data to display   Initial Impression / Assessment and Plan / ED Course  I have reviewed the triage vital signs and the nursing notes.  Pertinent labs & imaging results that were available during my care of the patient were reviewed by me and considered in my medical decision making (see chart for details).     No indication for emergent dialysis.  No signs of volume overload.  Potassium is not elevated.  I recommended that he return to [facility] to have his routine dialysis performed.  He understands that if he develops any new or worsening symptoms he is welcome to return the emergency department for evaluation.  Final Clinical Impressions(s) / ED Diagnoses   Final diagnoses:  ESRD (end stage renal disease) (HCC)    New Prescriptions New Prescriptions   No  medications on file     Azalia Bilis, MD 11/21/16 [phone]

## 2016-11-22 ENCOUNTER — Non-Acute Institutional Stay (HOSPITAL_COMMUNITY)
Admission: EM | Admit: 2016-11-22 | Discharge: 2016-11-22 | Disposition: A | Discharge status: Home / Self Care | Payer: Medicare Other | Attending: Emergency Medicine | Admitting: Emergency Medicine

## 2016-11-22 ENCOUNTER — Encounter (HOSPITAL_COMMUNITY): Payer: Self-pay | Admitting: Neurology

## 2016-11-22 DIAGNOSIS — D571 Sickle-cell disease without crisis: Secondary | ICD-10-CM | POA: Insufficient documentation

## 2016-11-22 DIAGNOSIS — I120 Hypertensive chronic kidney disease with stage 5 chronic kidney disease or end stage renal disease: Secondary | ICD-10-CM | POA: Insufficient documentation

## 2016-11-22 DIAGNOSIS — N186 End stage renal disease: Secondary | ICD-10-CM | POA: Diagnosis not present

## 2016-11-22 DIAGNOSIS — Z8673 Personal history of transient ischemic attack (TIA), and cerebral infarction without residual deficits: Secondary | ICD-10-CM | POA: Insufficient documentation

## 2016-11-22 DIAGNOSIS — Z7982 Long term (current) use of aspirin: Secondary | ICD-10-CM | POA: Insufficient documentation

## 2016-11-22 DIAGNOSIS — F319 Bipolar disorder, unspecified: Secondary | ICD-10-CM | POA: Insufficient documentation

## 2016-11-22 DIAGNOSIS — Z79899 Other long term (current) drug therapy: Secondary | ICD-10-CM | POA: Diagnosis not present

## 2016-11-22 DIAGNOSIS — Z992 Dependence on renal dialysis: Secondary | ICD-10-CM | POA: Insufficient documentation

## 2016-11-22 LAB — COMPREHENSIVE METABOLIC PANEL
ALK PHOS: 612 U/L — AB (ref 38–126)
ALT: 15 U/L — ABNORMAL LOW (ref 17–63)
ANION GAP: 16 — AB (ref 5–15)
AST: 16 U/L (ref 15–41)
Albumin: 3.6 g/dL (ref 3.5–5.0)
BILIRUBIN TOTAL: 0.8 mg/dL (ref 0.3–1.2)
BUN: 74 mg/dL — ABNORMAL HIGH (ref 6–20)
CO2: 22 mmol/L (ref 22–32)
Calcium: 8 mg/dL — ABNORMAL LOW (ref 8.9–10.3)
Chloride: 99 mmol/L — ABNORMAL LOW (ref 101–111)
Creatinine, Ser: 13.36 mg/dL — ABNORMAL HIGH (ref 0.61–1.24)
GFR, EST AFRICAN AMERICAN: 4 mL/min — AB (ref >60)
GFR, EST NON AFRICAN AMERICAN: 4 mL/min — AB (ref >60)
Glucose, Bld: 95 mg/dL (ref 65–99)
POTASSIUM: 5.1 mmol/L (ref 3.5–5.1)
Sodium: 137 mmol/L (ref 135–145)
Total Protein: 6.6 g/dL (ref 6.5–8.1)

## 2016-11-22 LAB — CBC WITH DIFFERENTIAL/PLATELET
Basophils Absolute: 0 10*3/uL (ref 0.0–0.1)
Basophils Relative: 0 %
Eosinophils Absolute: 0.9 10*3/uL — ABNORMAL HIGH (ref 0.0–0.7)
Eosinophils Relative: 12 %
HEMATOCRIT: 23.7 % — AB (ref 39.0–52.0)
HEMOGLOBIN: 7.8 g/dL — AB (ref 13.0–17.0)
LYMPHS ABS: 1.2 10*3/uL (ref 0.7–4.0)
Lymphocytes Relative: 16 %
MCH: 29.9 pg (ref 26.0–34.0)
MCHC: 32.9 g/dL (ref 30.0–36.0)
MCV: 90.8 fL (ref 78.0–100.0)
MONO ABS: 1 10*3/uL (ref 0.1–1.0)
Monocytes Relative: 13 %
NEUTROS ABS: 4.4 10*3/uL (ref 1.7–7.7)
NEUTROS PCT: 59 %
Platelets: 187 10*3/uL (ref 150–400)
RBC: 2.61 MIL/uL — ABNORMAL LOW (ref 4.22–5.81)
RDW: 14.5 % (ref 11.5–15.5)
WBC: 7.6 10*3/uL (ref 4.0–10.5)

## 2016-11-22 NOTE — ED Triage Notes (Signed)
Per ems- pt is in town from [place], [place] for his birthday. Last dialysis was on Tuesday. C/o abdominal pain, and back pain started last night. Reports he fell last night b/c his back was hurting. Tried to get into a dialysis center yesterday, but couldn't. BP 210/108. Is a x 4. 100% RA, CBG 135.
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
## 2016-11-22 NOTE — Procedures (Signed)
   I was present at this dialysis session, have reviewed the session itself and made  appropriate changes Rob Erum Cercone MD [facility] pager [phone]   11/22/2016, 2:45 PM    

## 2016-11-22 NOTE — Progress Notes (Signed)
Pt completed HD without complications; denies pain, nausea/vomitting, dizziness; or SOB. Accompanied the pt to the ED to wait for his ride.

## 2016-11-22 NOTE — ED Provider Notes (Signed)
[facility]-EMERGENCY DEPT Provider Note   [name]: [id number] Arrival date & time: 11/22/16  4098     History   Chief Complaint Chief Complaint  Patient presents with  . Vascular Access Problem    HPI CORNELLIUS Bell is a 56 y.o. male.  The history is provided by the patient. No language interpreter was used.  Abdominal Pain   This is a recurrent problem. The problem occurs constantly. The problem has been gradually worsening. The pain is moderate. Nothing aggravates the symptoms. Nothing relieves the symptoms.  Pt reports he needs dialysis.  Pt reports he last had dialysis on 10/2. Pt reports he came to [place] for his birthday.  Pt reports he does not have any family or friends here.  Pt states his wife died here 4 years ago.  Pt reports he is here until Monday and needs dialysis.  Pt reports he has a bus pass to return to [place] on Monday.    I reviewed pt's records.  He has a history on noncompliance.  He has been dismissed from dialysis center and goes to ED for dialysis when he needs.     Past Medical History:  Diagnosis Date  . ESRD on dialysis (HCC)   . Hypertension   . Renal failure   . Sickle cell anemia (HCC)   . Stroke [facility])    States he had 3 strokes at once    Patient Active Problem List   Diagnosis Date Noted  . Hypertensive emergency 04/21/2012  . Anemia in chronic kidney disease 04/21/2012  . Pulmonary edema 04/19/2012  . Fluid overload 04/19/2012  . HTN (hypertension), malignant 04/19/2012  . Bipolar 1 disorder, manic, moderate (HCC) 06/19/2011    Class: Acute  . Abdominal pain 06/17/2011  . ESRD (end stage renal disease) on dialysis (HCC) 06/17/2011  . Chest pain 04/10/2011  . ESRD (end stage renal disease) (HCC) 04/10/2011  . Hypertension 04/10/2011  . Pulmonary nodule 03/25/2011    Past Surgical History:  Procedure Laterality Date  . KIDNEY STONE SURGERY         Home Medications    Prior to Admission medications   Medication Sig Start  Date End Date Taking? Authorizing Provider  albuterol (PROVENTIL HFA;VENTOLIN HFA) 108 (90 Base) MCG/ACT inhaler Inhale 2 puffs into the lungs every 6 (six) hours as needed.   Yes [provider]  amLODipine (NORVASC) 10 MG tablet Take 10 mg by mouth daily.   Yes [provider]  ARIPiprazole (ABILIFY) 2 MG tablet Take 2 mg by mouth every evening.   Yes [provider]  aspirin EC 81 MG tablet Take 81 mg by mouth daily.    Yes [provider]  atorvastatin (LIPITOR) 80 MG tablet Take 80 mg by mouth daily. 11/06/16  Yes [provider]  carvedilol (COREG) 25 MG tablet Take 25 mg by mouth 2 (two) times daily. 07/25/16  Yes [provider]  cinacalcet (SENSIPAR) 30 MG tablet Take 30 mg by mouth daily with breakfast. 04/22/12  Yes Hongalgi, Maximino Greenland, MD  cloNIDine (CATAPRES) 0.2 MG tablet Take 1 tablet (0.2 mg total) by mouth 3 (three) times daily. Patient taking differently: Take 0.2 mg by mouth 2 (two) times daily.  04/22/12  Yes Hongalgi, Maximino Greenland, MD  escitalopram (LEXAPRO) 10 MG tablet Take 10 mg by mouth every evening.   Yes [provider]  hydrALAZINE (APRESOLINE) 10 MG tablet Take 10 mg by mouth every 8 (eight) hours. 09/17/16  Yes [provider]  isosorbide mononitrate (IMDUR) 60 MG 24 hr tablet Take 60 mg by mouth daily. 11/06/16  Yes [provider]  lisinopril (PRINIVIL,ZESTRIL) 10 MG tablet Take 2 tablets (20 mg total) by mouth daily. 04/22/12  Yes Elease Etienne, MD  LORazepam (ATIVAN) 1 MG tablet Take 1 mg by mouth every Monday, Wednesday, and Friday. Monday Wednesday and Friday before hemodialysis.   Yes [provider]  calcium acetate (PHOSLO) 667 MG capsule Take 667-2,001 mg by mouth 5 (five) times daily. 3 capsules (2001 mg) with meals and 1 capsule (667 mg) with snacks    [provider]  HYDROcodone-acetaminophen (NORCO/VICODIN) 5-325 MG per tablet Take 2 tablets by mouth every 6 (six) hours as  needed for pain. 01/08/12   Gerhard Munch, MD    Family History Family History  Problem Relation Age of Onset  . Heart disease Mother   . Heart disease Father     Social History Social History  Substance Use Topics  . Smoking status: Never Smoker  . Smokeless tobacco: Never Used  . Alcohol use No     Allergies   Latex and Other   Review of Systems Review of Systems  Gastrointestinal: Positive for abdominal pain.  All other systems reviewed and are negative.    Physical Exam Updated Vital Signs BP (!) 193/73   Pulse 73   Temp 98 F (36.7 C) (Oral)   Resp (!) 23   Ht  (1.778 m)   Wt 115.7 kg (255 lb)   SpO2 100%   BMI 36.59 kg/m   Physical Exam  Constitutional: He appears well-developed and well-nourished.  HENT:  Head: Normocephalic and atraumatic.  Eyes: Conjunctivae are normal.  Neck: Neck supple.  Cardiovascular: Normal rate and regular rhythm.   No murmur heard. Pulmonary/Chest: Effort normal and breath sounds normal. No respiratory distress.  Abdominal: Soft. There is no tenderness.  Musculoskeletal: He exhibits no edema.  Neurological: He is alert.  Skin: Skin is warm and dry.  Psychiatric: He has a normal mood and affect.  Nursing note and vitals reviewed.    ED Treatments / Results  Labs (all labs ordered are listed, but only abnormal results are displayed) Labs Reviewed  CBC WITH DIFFERENTIAL/PLATELET - Abnormal; Notable for the following:       Result Value   RBC 2.61 (*)    Hemoglobin 7.8 (*)    HCT 23.7 (*)    Eosinophils Absolute 0.9 (*)    All other components within normal limits  COMPREHENSIVE METABOLIC PANEL - Abnormal; Notable for the following:    Chloride 99 (*)    BUN 74 (*)    Creatinine, Ser 13.36 (*)    Calcium 8.0 (*)    ALT 15 (*)    Alkaline Phosphatase 612 (*)    GFR calc non Af Amer 4 (*)    GFR calc Af Amer 4 (*)    Anion gap 16 (*)    All other components within normal limits    EKG  EKG  Interpretation None       Radiology No results found.  Procedures Procedures (including critical care time)  Medications Ordered in ED Medications - No data to display   Initial Impression / Assessment and Plan / ED Course  I have reviewed the triage vital signs and the nursing notes.  Pertinent labs & imaging results that were available during my care of the patient were reviewed by me and considered in my medical decision  making (see chart for details).     I spoke with Dr. Arlean Hopping who will see and evaluate for possible dialysis here today.  Dr. Arlean Hopping evaluated pt.  Pt will have dialysis and then be discharged.  Final Clinical Impressions(s) / ED Diagnoses   Final diagnoses:  ESRD (end stage renal disease) (HCC)    New Prescriptions New Prescriptions   No medications on file  An After Visit Summary was printed and given to the patient.   Elson Areas, PA-C 11/22/16 1305    Elson Areas, PA-C 11/22/16 1307    Vanetta Mulders, MD 11/25/16 [phone]

## 2017-12-18 DEATH — deceased
